# Patient Record
Sex: Female | Born: 1985 | Race: Black or African American | Hispanic: No | Marital: Single | Smoking: Never smoker
Health system: Southern US, Community
[De-identification: ages and names within clinical notes are randomized; demographics above are authoritative.]

## PROBLEM LIST (undated history)

## (undated) DIAGNOSIS — F99 Mental disorder, not otherwise specified: Secondary | ICD-10-CM

## (undated) DIAGNOSIS — K5904 Chronic idiopathic constipation: Secondary | ICD-10-CM

## (undated) DIAGNOSIS — F29 Unspecified psychosis not due to a substance or known physiological condition: Secondary | ICD-10-CM

## (undated) DIAGNOSIS — F72 Severe intellectual disabilities: Secondary | ICD-10-CM

## (undated) HISTORY — PX: NO PAST SURGERIES: SHX2092

---

## 2007-06-18 ENCOUNTER — Ambulatory Visit (HOSPITAL_COMMUNITY): Admission: RE | Admit: 2007-06-18 | Discharge: 2007-06-18 | Payer: Self-pay | Admitting: Obstetrics and Gynecology

## 2007-10-28 ENCOUNTER — Emergency Department (HOSPITAL_COMMUNITY): Admission: EM | Admit: 2007-10-28 | Discharge: 2007-10-30 | Payer: Self-pay | Admitting: Emergency Medicine

## 2007-12-08 ENCOUNTER — Emergency Department (HOSPITAL_COMMUNITY): Admission: EM | Admit: 2007-12-08 | Discharge: 2007-12-08 | Payer: Self-pay | Admitting: Emergency Medicine

## 2009-08-30 ENCOUNTER — Emergency Department (HOSPITAL_COMMUNITY): Admission: EM | Admit: 2009-08-30 | Discharge: 2009-08-30 | Payer: Self-pay | Admitting: Emergency Medicine

## 2010-09-02 ENCOUNTER — Inpatient Hospital Stay (INDEPENDENT_AMBULATORY_CARE_PROVIDER_SITE_OTHER)
Admission: RE | Admit: 2010-09-02 | Discharge: 2010-09-02 | Disposition: A | Discharge status: Home / Self Care | Payer: Medicare Other | Source: Ambulatory Visit | Attending: Family Medicine | Admitting: Family Medicine

## 2010-09-02 DIAGNOSIS — N390 Urinary tract infection, site not specified: Secondary | ICD-10-CM

## 2010-09-02 LAB — POCT URINALYSIS DIP (DEVICE)
Bilirubin Urine: NEGATIVE
Glucose, UA: NEGATIVE mg/dL
Hgb urine dipstick: NEGATIVE
Ketones, ur: NEGATIVE mg/dL
Protein, ur: 30 mg/dL — AB
Urobilinogen, UA: 0.2 mg/dL (ref 0.0–1.0)

## 2010-09-02 LAB — POCT PREGNANCY, URINE: Preg Test, Ur: NEGATIVE

## 2010-09-04 LAB — URINE CULTURE: Colony Count: 75000

## 2010-09-22 ENCOUNTER — Emergency Department (HOSPITAL_COMMUNITY)
Admission: EM | Admit: 2010-09-22 | Discharge: 2010-09-22 | Disposition: A | Discharge status: Home / Self Care | Payer: Medicare Other | Attending: Emergency Medicine | Admitting: Emergency Medicine

## 2010-09-22 ENCOUNTER — Inpatient Hospital Stay (INDEPENDENT_AMBULATORY_CARE_PROVIDER_SITE_OTHER)
Admission: RE | Admit: 2010-09-22 | Discharge: 2010-09-22 | Disposition: A | Discharge status: Home / Self Care | Payer: Medicare Other | Source: Ambulatory Visit | Attending: Emergency Medicine | Admitting: Emergency Medicine

## 2010-09-22 ENCOUNTER — Emergency Department (HOSPITAL_COMMUNITY): Payer: Medicare Other

## 2010-09-22 DIAGNOSIS — M549 Dorsalgia, unspecified: Secondary | ICD-10-CM | POA: Insufficient documentation

## 2010-09-22 DIAGNOSIS — F79 Unspecified intellectual disabilities: Secondary | ICD-10-CM | POA: Insufficient documentation

## 2010-09-22 DIAGNOSIS — Z79899 Other long term (current) drug therapy: Secondary | ICD-10-CM | POA: Insufficient documentation

## 2010-09-22 DIAGNOSIS — R079 Chest pain, unspecified: Secondary | ICD-10-CM | POA: Insufficient documentation

## 2010-09-22 LAB — POCT URINALYSIS DIP (DEVICE)
Bilirubin Urine: NEGATIVE
Glucose, UA: NEGATIVE mg/dL
Nitrite: NEGATIVE
Urobilinogen, UA: 0.2 mg/dL (ref 0.0–1.0)

## 2010-10-03 ENCOUNTER — Telehealth: Payer: Self-pay | Admitting: Family Medicine

## 2010-10-03 NOTE — Telephone Encounter (Signed)
spooke w pt--menorrhagia. Will set her up for pap and appt this week Denny Levy  [facility] Can you put her in on this weeks colpo clinic at 9 am for a pap and blood work [employer]! Denny Levy

## 2010-10-06 ENCOUNTER — Ambulatory Visit: Payer: Self-pay

## 2010-11-07 LAB — CBC
MCHC: 33
MCV: 85.7
Platelets: 352
RBC: 2.73 — ABNORMAL LOW
WBC: 7.5

## 2010-11-07 LAB — DIFFERENTIAL
Basophils Absolute: 0
Eosinophils Relative: 0
Lymphocytes Relative: 9 — ABNORMAL LOW
Lymphs Abs: 0.6 — ABNORMAL LOW
Monocytes Absolute: 0.6

## 2010-11-07 LAB — ETHANOL: Alcohol, Ethyl (B): <5

## 2010-11-07 LAB — BASIC METABOLIC PANEL
BUN: 22
CO2: 28
Calcium: 9.2
Creatinine, Ser: 0.91
GFR calc Af Amer: >60

## 2011-04-19 ENCOUNTER — Encounter (HOSPITAL_COMMUNITY)
Admission: RE | Admit: 2011-04-19 | Discharge: 2011-04-19 | Disposition: A | Discharge status: Home / Self Care | Payer: Medicare Other | Source: Ambulatory Visit | Attending: Dentistry | Admitting: Dentistry

## 2011-04-19 ENCOUNTER — Encounter (HOSPITAL_COMMUNITY): Payer: Self-pay | Admitting: Pharmacy Technician

## 2011-04-19 ENCOUNTER — Encounter (HOSPITAL_COMMUNITY): Payer: Self-pay

## 2011-04-19 HISTORY — DX: Mental disorder, not otherwise specified: F99

## 2011-04-19 HISTORY — DX: Severe intellectual disabilities: F72

## 2011-04-19 HISTORY — DX: Chronic idiopathic constipation: K59.04

## 2011-04-19 HISTORY — DX: Unspecified psychosis not due to a substance or known physiological condition: F29

## 2011-04-19 NOTE — Pre-Procedure Instructions (Signed)
22 Michele French  04/19/2011   Your procedure is scheduled on:  Friday, Mar 22  Report to [facility] at 1050 AM.  Call this number if you have problems the morning of surgery: [phone]   Remember:   Do not eat food:After Midnight.  May have clear liquids: up to 4 Hours before arrival.(0650 am)  Clear liquids include soda, tea, black coffee, apple or grape juice, broth.  Take these medicines the morning of surgery with A SIP OF WATER: *Seroquel,Lorazepam,Diphenhydramine**   Do not wear jewelry, make-up or nail polish.  Do not wear lotions, powders, or perfumes. You may wear deodorant.  Do not shave 48 hours prior to surgery.  Do not bring valuables to the hospital.  Contacts, dentures or bridgework may not be worn into surgery.  Leave suitcase in the car. After surgery it may be brought to your room.  For patients admitted to the hospital, checkout time is 11:00 AM the day of discharge.   Patients discharged the day of surgery will not be allowed to drive home.  Name and phone number of your driver: *Joneen Roach, caregiver**  Special Instructions: CHG Shower Use Special Wash: 1/2 bottle night before surgery and 1/2 bottle morning of surgery.   Please read over the following fact sheets that you were given: Surgical Site Infection Prevention

## 2011-04-28 ENCOUNTER — Ambulatory Visit (HOSPITAL_COMMUNITY)
Admission: RE | Admit: 2011-04-28 | Discharge: 2011-04-28 | Disposition: A | Discharge status: Home / Self Care | Payer: Medicare Other | Source: Ambulatory Visit | Attending: Dentistry | Admitting: Dentistry

## 2011-04-28 ENCOUNTER — Encounter (HOSPITAL_COMMUNITY)
Admission: RE | Disposition: A | Discharge status: Home / Self Care | Payer: Self-pay | Source: Ambulatory Visit | Attending: Dentistry

## 2011-04-28 ENCOUNTER — Encounter (HOSPITAL_COMMUNITY): Payer: Self-pay | Admitting: *Deleted

## 2011-04-28 ENCOUNTER — Encounter (HOSPITAL_COMMUNITY): Payer: Self-pay | Admitting: Anesthesiology

## 2011-04-28 ENCOUNTER — Encounter (HOSPITAL_COMMUNITY): Payer: Self-pay | Admitting: Dentistry

## 2011-04-28 ENCOUNTER — Ambulatory Visit (HOSPITAL_COMMUNITY): Payer: Medicare Other | Admitting: Anesthesiology

## 2011-04-28 DIAGNOSIS — F79 Unspecified intellectual disabilities: Secondary | ICD-10-CM | POA: Insufficient documentation

## 2011-04-28 DIAGNOSIS — K029 Dental caries, unspecified: Secondary | ICD-10-CM | POA: Insufficient documentation

## 2011-04-28 HISTORY — PX: TOOTH EXTRACTION: SHX859

## 2011-04-28 SURGERY — DENTAL RESTORATION/EXTRACTIONS
Anesthesia: General | Site: Mouth | Laterality: Bilateral | Wound class: Clean Contaminated

## 2011-04-28 MED ORDER — LACTATED RINGERS IV SOLN
INTRAVENOUS | Status: DC | PRN
  Administered 2011-04-28 (×2): via INTRAVENOUS

## 2011-04-28 MED ORDER — MIDAZOLAM HCL 2 MG/ML PO SYRP
ORAL_SOLUTION | ORAL | Status: AC
  Administered 2011-04-28: 20 mg via ORAL
  Filled 2011-04-28: qty 10

## 2011-04-28 MED ORDER — ONDANSETRON HCL 4 MG/2ML IJ SOLN
INTRAMUSCULAR | Status: DC | PRN
  Administered 2011-04-28: 4 mg via INTRAVENOUS

## 2011-04-28 MED ORDER — HEMOSTATIC AGENTS (NO CHARGE) OPTIME
TOPICAL | Status: DC | PRN
  Administered 2011-04-28: 1 via TOPICAL

## 2011-04-28 MED ORDER — PROPOFOL 10 MG/ML IV EMUL
INTRAVENOUS | Status: DC | PRN
  Administered 2011-04-28: 70 mg via INTRAVENOUS

## 2011-04-28 MED ORDER — FENTANYL CITRATE 0.05 MG/ML IJ SOLN
INTRAMUSCULAR | Status: DC | PRN
  Administered 2011-04-28: 50 ug via INTRAVENOUS
  Administered 2011-04-28: 100 ug via INTRAVENOUS
  Administered 2011-04-28: 25 ug via INTRAVENOUS

## 2011-04-28 MED ORDER — OXYMETAZOLINE HCL 0.05 % NA SOLN
NASAL | Status: DC | PRN
  Administered 2011-04-28: 2 via NASAL

## 2011-04-28 MED ORDER — LIDOCAINE-EPINEPHRINE 2 %-1:100000 IJ SOLN
INTRAMUSCULAR | Status: DC | PRN
  Administered 2011-04-28: 5 mL

## 2011-04-28 SURGICAL SUPPLY — 40 items
BLADE SURG 15 STRL LF DISP TIS (BLADE) IMPLANT
BLADE SURG 15 STRL SS (BLADE)
CANISTER SUCTION 2500CC (MISCELLANEOUS) ×2 IMPLANT
CLOTH BEACON ORANGE TIMEOUT ST (SAFETY) ×2 IMPLANT
CONT SPEC 4OZ CLIKSEAL STRL BL (MISCELLANEOUS) IMPLANT
COVER PROBE W GEL 5X96 (DRAPES) ×2 IMPLANT
COVER SURGICAL LIGHT HANDLE (MISCELLANEOUS) ×2 IMPLANT
COVER TABLE BACK 60X90 (DRAPES) ×2 IMPLANT
DECANTER SPIKE VIAL GLASS SM (MISCELLANEOUS) IMPLANT
DRAPE PROXIMA HALF (DRAPES) ×2 IMPLANT
ELECT COATED BLADE 2.86 ST (ELECTRODE) ×2 IMPLANT
ELECT REM PT RETURN 9FT ADLT (ELECTROSURGICAL) ×2
ELECTRODE REM PT RTRN 9FT ADLT (ELECTROSURGICAL) ×1 IMPLANT
GAUZE PACKING FOLDED 2  STR (GAUZE/BANDAGES/DRESSINGS)
GAUZE PACKING FOLDED 2 STR (GAUZE/BANDAGES/DRESSINGS) IMPLANT
GAUZE SPONGE 2X2 8PLY STRL LF (GAUZE/BANDAGES/DRESSINGS) IMPLANT
GAUZE SPONGE 4X4 16PLY XRAY LF (GAUZE/BANDAGES/DRESSINGS) ×2 IMPLANT
GLOVE BIO SURGEON STRL SZ7.5 (GLOVE) ×2 IMPLANT
GLOVE SS BIOGEL STRL SZ 6.5 (GLOVE) ×1 IMPLANT
GLOVE SUPERSENSE BIOGEL SZ 6.5 (GLOVE) ×1
GOWN STRL NON-REIN LRG LVL3 (GOWN DISPOSABLE) ×4 IMPLANT
KIT BASIN OR (CUSTOM PROCEDURE TRAY) ×2 IMPLANT
KIT ROOM TURNOVER OR (KITS) ×2 IMPLANT
NEEDLE 27GAX1X1/2 (NEEDLE) ×2 IMPLANT
NEEDLE FILTER BLUNT 18X 1/2SAF (NEEDLE)
NEEDLE FILTER BLUNT 18X1 1/2 (NEEDLE) IMPLANT
PAD ARMBOARD 7.5X6 YLW CONV (MISCELLANEOUS) ×2 IMPLANT
PENCIL BUTTON HOLSTER BLD 10FT (ELECTRODE) ×2 IMPLANT
SPONGE GAUZE 2X2 STER 10/PKG (GAUZE/BANDAGES/DRESSINGS)
SPONGE GAUZE 4X4 12PLY (GAUZE/BANDAGES/DRESSINGS) IMPLANT
SPONGE SURGIFOAM ABS GEL 12-7 (HEMOSTASIS) ×2 IMPLANT
SPONGE SURGIFOAM ABS GEL SZ50 (HEMOSTASIS) IMPLANT
SUT CHROMIC 3 0 PS 2 (SUTURE) ×2 IMPLANT
SYR CONTROL 10ML LL (SYRINGE) ×2 IMPLANT
TOOTHBRUSH ADULT (PERSONAL CARE ITEMS) IMPLANT
TOWEL OR 17X24 6PK STRL BLUE (TOWEL DISPOSABLE) ×2 IMPLANT
TOWEL OR 17X26 10 PK STRL BLUE (TOWEL DISPOSABLE) IMPLANT
TUBE CONNECTING 12X1/4 (SUCTIONS) ×2 IMPLANT
WATER STERILE IRR 1000ML POUR (IV SOLUTION) ×4 IMPLANT
YANKAUER SUCT BULB TIP NO VENT (SUCTIONS) ×2 IMPLANT

## 2011-04-28 NOTE — Transfer of Care (Signed)
Immediate Anesthesia Transfer of Care Note  Patient: Michele French  Procedure(s) Performed: Procedure(s) (LRB): DENTAL RESTORATION/EXTRACTIONS (Bilateral)  Patient Location: PACU  Anesthesia Type: General  Level of Consciousness: awake  Airway & Oxygen Therapy: Patient Spontanous Breathing and Patient connected to nasal cannula oxygen  Post-op Assessment: Report given to PACU RN and Post -op Vital signs reviewed and stable  Post vital signs: Reviewed  Complications: No apparent anesthesia complications

## 2011-04-28 NOTE — Anesthesia Preprocedure Evaluation (Addendum)
Anesthesia Evaluation  Patient identified by MRN, date of birth, ID band Patient unresponsive    Reviewed: Allergy & Precautions, H&P , NPO status , Patient's Chart, lab work & pertinent test results  History of Anesthesia Complications Negative for: history of anesthetic complications  Airway Mallampati: II TM Distance: >3 FB Neck ROM: Full    Dental  (+) Poor Dentition and Dental Advisory Given   Pulmonary neg pulmonary ROS,  breath sounds clear to auscultation  Pulmonary exam normal       Cardiovascular negative cardio ROS  Rhythm:Regular     Neuro/Psych PSYCHIATRIC DISORDERS Bipolar Disorder Schizophrenia Severe MR   GI/Hepatic negative GI ROS, Neg liver ROS,   Endo/Other    Renal/GU negative Renal ROS     Musculoskeletal   Abdominal   Peds  Hematology   Anesthesia Other Findings   Reproductive/Obstetrics                          Anesthesia Physical Anesthesia Plan  ASA: III  Anesthesia Plan: General   Post-op Pain Management:    Induction: Intravenous  Airway Management Planned: Nasal ETT  Additional Equipment:   Intra-op Plan:   Post-operative Plan: Extubation in OR  Informed Consent: I have reviewed the patients History and Physical, chart, labs and discussed the procedure including the risks, benefits and alternatives for the proposed anesthesia with the patient or authorized representative who has indicated his/her understanding and acceptance.   Dental advisory given  Plan Discussed with: CRNA, Anesthesiologist and Surgeon  Anesthesia Plan Comments:         Anesthesia Quick Evaluation

## 2011-04-28 NOTE — Discharge Instructions (Signed)
See Extraction Sheet - give to patient

## 2011-04-28 NOTE — Progress Notes (Signed)
Order receive by DR singer to give 20ml po versed.

## 2011-04-28 NOTE — Progress Notes (Signed)
Unable to find extraction instructions in EPIC.  Dr. Martie Round notified.  He stated that he head given extraction instructions to [facility], Group home care giver.  Marcelene Butte, group home care giver notified.//L. Andriea Hasegawa,RN

## 2011-04-28 NOTE — Preoperative (Signed)
Beta Blockers   Reason not to administer Beta Blockers:Not Applicable 

## 2011-04-28 NOTE — Op Note (Signed)
Recall Exam, FMX, FMD, 4 Extractions, 1 Composite with 5cc 2%Lidocaine 1:100,000 epinephrine

## 2011-04-28 NOTE — Anesthesia Procedure Notes (Signed)
Procedure Name: Intubation Date/Time: 04/28/2011 12:39 PM Performed by: Lovie Chol Pre-anesthesia Checklist: Patient identified, Emergency Drugs available, Suction available and Patient being monitored Patient Re-evaluated:Patient Re-evaluated prior to inductionOxygen Delivery Method: Circle system utilized Preoxygenation: Pre-oxygenation with 100% oxygen Intubation Type: Inhalational induction Ventilation: Mask ventilation without difficulty Laryngoscope Size: Miller and 2 Grade View: Grade I Nasal Tubes: Right and Nasal Rae Number of attempts: 1 Placement Confirmation: ETT inserted through vocal cords under direct vision,  positive ETCO2 and breath sounds checked- equal and bilateral Secured at: 27 cm Tube secured with: Tape Dental Injury: Teeth and Oropharynx as per pre-operative assessment

## 2011-04-28 NOTE — Brief Op Note (Addendum)
04/28/2011  2:04 PM  PATIENT:  Michele French  26 y.o. female  PRE-OPERATIVE DIAGNOSIS:  DENTAL CARIES  POST-OPERATIVE DIAGNOSIS:    PROCEDURE:  Procedure(s) (LRB): DENTAL RESTORATION/EXTRACTIONS (Bilateral)  SURGEON:  Surgeon(s) and Role:    * Esaw Dace., DDS - Primary  PHYSICIAN ASSISTANT:   ASSISTANTS: Laqueta Carina   ANESTHESIA:   general  EBL:  Total I/O In: 1100 [I.V.:1100] Out: -   BLOOD ADMINISTERED:none  DRAINS: none   LOCAL MEDICATIONS USED:  LIDOCAINE   SPECIMEN:  No Specimen  DISPOSITION OF SPECIMEN:  N/A  COUNTS:  YES  TOURNIQUET:  * No tourniquets in log *  DICTATION: .Phone dictation  PLAN OF CARE: Recall Exam, FMX, FMD, 4 extractions, 1 composite  PATIENT DISPOSITION:  Discharged to home   Delay start of Pharmacological VTE agent (>24hrs) due to surgical blood loss or risk of bleeding: no

## 2011-04-28 NOTE — H&P (Signed)
H&P Reviewed. Pt re-examined. No changes.  

## 2011-04-29 NOTE — Op Note (Signed)
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  Dental care provided in the operating room for medically necessary treatment.  OPERATION:  Full mouth oral rehabilitation including exam, x-rays, cleaning, extractions and operative.  SURGEON:  Azucena Freed, DDS  ASSISTANT:  Laqueta Carina.  PROCEDURE:  The patient was brought in to the operating room and placed in the supine position.  General anesthesia was administered via nasal intubation.  The patient was prepped and draped in the usual manner for an intraoral general dentistry procedure.  The oropharynx was suctioned and a moistened posterior throat pack was placed.  A full intraoral exam including all hard and soft tissues was performed.  This was a recall exam.  Soft tissue exam reveals floor of the mouth, buccal mucosa, soft palate, hard palate, tongue, gingiva, and frenum attachments, all within normal limits with regard to size, color, and consistency.  The hard tissue exam reveals tooth #1 through #32 present.  A full mouth digital x-rays were taken and a full mouth debridement was completed.  Operative care was provided with a high-speed handpiece of #557 and #4 bur and a slow-speed handpiece #4 bur.  Composite was completed on the facial of #29, extractions were completed on #7 through #10.  An amalgam was completed on #3, AMOF.  Gelfoam was placed in the socket and 3-o chromic suture was used to close the extraction site.  5 mL of 2% lidocaine with 1:100,000 epinephrine was given and estimated blood loss of 20 mL. Mouth  was suctioned dry, a posterior throat pack was carefully removed with constant suction and the patient was awakened in the OR and transferred to the recovery room in good condition.  A 600 mg ibuprofen was prescribed postop with 12 tabs given every 4 hours p.r.n. pain.     Esaw Dace., D.D.S.     WEM/MEDQ  D:  04/28/2011  T:  04/29/2011  Job:  [id number]

## 2011-05-01 ENCOUNTER — Encounter (HOSPITAL_COMMUNITY): Payer: Self-pay | Admitting: Dentistry

## 2011-05-02 NOTE — Anesthesia Postprocedure Evaluation (Signed)
Anesthesia Post Note  Patient: Michele French  Procedure(s) Performed: Procedure(s) (LRB): DENTAL RESTORATION/EXTRACTIONS (Bilateral)  Anesthesia type: general  Patient location: PACU  Post pain: Pain level controlled  Post assessment: Patient's Cardiovascular Status Stable  Last Vitals:  Filed Vitals:   04/28/11 1455  BP: 145/95  Pulse: 100  Temp:   Resp: 20    Post vital signs: Reviewed and stable  Level of consciousness: sedated  Complications: No apparent anesthesia complications

## 2011-10-06 ENCOUNTER — Encounter: Payer: Self-pay | Admitting: Dentistry

## 2012-02-29 ENCOUNTER — Encounter (HOSPITAL_BASED_OUTPATIENT_CLINIC_OR_DEPARTMENT_OTHER): Payer: Self-pay | Admitting: *Deleted

## 2012-02-29 ENCOUNTER — Emergency Department (HOSPITAL_BASED_OUTPATIENT_CLINIC_OR_DEPARTMENT_OTHER): Payer: Medicare Other

## 2012-02-29 ENCOUNTER — Emergency Department (HOSPITAL_BASED_OUTPATIENT_CLINIC_OR_DEPARTMENT_OTHER)
Admission: EM | Admit: 2012-02-29 | Discharge: 2012-02-29 | Disposition: A | Discharge status: Home / Self Care | Payer: Medicare Other | Attending: Emergency Medicine | Admitting: Emergency Medicine

## 2012-02-29 DIAGNOSIS — S060X0A Concussion without loss of consciousness, initial encounter: Secondary | ICD-10-CM | POA: Insufficient documentation

## 2012-02-29 DIAGNOSIS — W1809XA Striking against other object with subsequent fall, initial encounter: Secondary | ICD-10-CM | POA: Insufficient documentation

## 2012-02-29 DIAGNOSIS — Z789 Other specified health status: Secondary | ICD-10-CM | POA: Insufficient documentation

## 2012-02-29 DIAGNOSIS — S0230XA Fracture of orbital floor, unspecified side, initial encounter for closed fracture: Secondary | ICD-10-CM | POA: Insufficient documentation

## 2012-02-29 DIAGNOSIS — S060X9A Concussion with loss of consciousness of unspecified duration, initial encounter: Secondary | ICD-10-CM

## 2012-02-29 DIAGNOSIS — F29 Unspecified psychosis not due to a substance or known physiological condition: Secondary | ICD-10-CM | POA: Insufficient documentation

## 2012-02-29 DIAGNOSIS — F72 Severe intellectual disabilities: Secondary | ICD-10-CM | POA: Insufficient documentation

## 2012-02-29 DIAGNOSIS — Z593 Problems related to living in residential institution: Secondary | ICD-10-CM | POA: Insufficient documentation

## 2012-02-29 DIAGNOSIS — Z79899 Other long term (current) drug therapy: Secondary | ICD-10-CM | POA: Insufficient documentation

## 2012-02-29 DIAGNOSIS — Y9289 Other specified places as the place of occurrence of the external cause: Secondary | ICD-10-CM | POA: Insufficient documentation

## 2012-02-29 DIAGNOSIS — Z8719 Personal history of other diseases of the digestive system: Secondary | ICD-10-CM | POA: Insufficient documentation

## 2012-02-29 DIAGNOSIS — Y939 Activity, unspecified: Secondary | ICD-10-CM | POA: Insufficient documentation

## 2012-02-29 MED ORDER — ONDANSETRON 4 MG PO TBDP: 4.0000 mg | ORAL_TABLET | Freq: Three times a day (TID) | ORAL | Status: AC | PRN

## 2012-02-29 MED ORDER — ACETAMINOPHEN 500 MG PO TABS: 500.0000 mg | ORAL_TABLET | Freq: Four times a day (QID) | ORAL | Status: AC | PRN

## 2012-02-29 MED ORDER — ONDANSETRON 4 MG PO TBDP
4.0000 mg | ORAL_TABLET | Freq: Once | ORAL | Status: AC
  Administered 2012-02-29: 4 mg via ORAL

## 2012-02-29 MED ORDER — ONDANSETRON 4 MG PO TBDP
ORAL_TABLET | ORAL | Status: AC
  Administered 2012-02-29: 4 mg via ORAL
  Filled 2012-02-29: qty 1

## 2012-02-29 NOTE — ED Notes (Signed)
Patient transported to CT 

## 2012-02-29 NOTE — ED Notes (Signed)
MD at bedside. 
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
## 2012-03-01 ENCOUNTER — Encounter (HOSPITAL_COMMUNITY): Payer: Self-pay | Admitting: *Deleted

## 2012-03-01 ENCOUNTER — Emergency Department (HOSPITAL_COMMUNITY)
Admission: EM | Admit: 2012-03-01 | Discharge: 2012-03-01 | Disposition: A | Discharge status: Home / Self Care | Payer: Medicare Other | Attending: Emergency Medicine | Admitting: Emergency Medicine

## 2012-03-01 DIAGNOSIS — F29 Unspecified psychosis not due to a substance or known physiological condition: Secondary | ICD-10-CM | POA: Insufficient documentation

## 2012-03-01 DIAGNOSIS — K5900 Constipation, unspecified: Secondary | ICD-10-CM | POA: Insufficient documentation

## 2012-03-01 DIAGNOSIS — Z79899 Other long term (current) drug therapy: Secondary | ICD-10-CM | POA: Insufficient documentation

## 2012-03-01 DIAGNOSIS — F99 Mental disorder, not otherwise specified: Secondary | ICD-10-CM

## 2012-03-01 DIAGNOSIS — R000 Tachycardia, unspecified: Secondary | ICD-10-CM | POA: Insufficient documentation

## 2012-03-01 DIAGNOSIS — F919 Conduct disorder, unspecified: Secondary | ICD-10-CM | POA: Insufficient documentation

## 2012-03-01 DIAGNOSIS — F72 Severe intellectual disabilities: Secondary | ICD-10-CM | POA: Insufficient documentation

## 2012-03-01 DIAGNOSIS — Z3202 Encounter for pregnancy test, result negative: Secondary | ICD-10-CM | POA: Insufficient documentation

## 2012-03-01 LAB — RAPID URINE DRUG SCREEN, HOSP PERFORMED
Amphetamines: NOT DETECTED
Barbiturates: NOT DETECTED
Benzodiazepines: NOT DETECTED
Cocaine: NOT DETECTED
Opiates: NOT DETECTED
Tetrahydrocannabinol: NOT DETECTED

## 2012-03-01 LAB — COMPREHENSIVE METABOLIC PANEL
ALT: 13 U/L (ref 0–35)
AST: 20 U/L (ref 0–37)
Albumin: 3.3 g/dL — ABNORMAL LOW (ref 3.5–5.2)
Alkaline Phosphatase: 35 U/L — ABNORMAL LOW (ref 39–117)
BUN: 8 mg/dL (ref 6–23)
Chloride: 102 mEq/L (ref 96–112)
Potassium: 3.6 mEq/L (ref 3.5–5.1)
Sodium: 139 mEq/L (ref 135–145)
Total Bilirubin: 0.2 mg/dL — ABNORMAL LOW (ref 0.3–1.2)
Total Protein: 7.3 g/dL (ref 6.0–8.3)

## 2012-03-01 LAB — POCT PREGNANCY, URINE: Preg Test, Ur: NEGATIVE

## 2012-03-01 LAB — CBC
HCT: 40 % (ref 36.0–46.0)
Platelets: 216 10*3/uL (ref 150–400)
RDW: 13.8 % (ref 11.5–15.5)
WBC: 6.6 10*3/uL (ref 4.0–10.5)

## 2012-03-01 LAB — ETHANOL: Alcohol, Ethyl (B): <11 mg/dL (ref 0–11)

## 2012-03-01 MED ORDER — ALUM & MAG HYDROXIDE-SIMETH 200-200-20 MG/5ML PO SUSP: 30.0000 mL | ORAL | Status: DC | PRN

## 2012-03-01 MED ORDER — IBUPROFEN 200 MG PO TABS: 600.0000 mg | ORAL_TABLET | Freq: Three times a day (TID) | ORAL | Status: DC | PRN

## 2012-03-01 MED ORDER — ONDANSETRON HCL 4 MG PO TABS: 4.0000 mg | ORAL_TABLET | Freq: Three times a day (TID) | ORAL | Status: DC | PRN

## 2012-03-01 MED ORDER — LORAZEPAM 1 MG PO TABS: 1.0000 mg | ORAL_TABLET | Freq: Three times a day (TID) | ORAL | Status: DC | PRN

## 2012-03-01 MED ORDER — NICOTINE 21 MG/24HR TD PT24: 21.0000 mg | MEDICATED_PATCH | Freq: Every day | TRANSDERMAL | Status: DC

## 2012-03-01 MED ORDER — ZOLPIDEM TARTRATE 5 MG PO TABS: 5.0000 mg | ORAL_TABLET | Freq: Every evening | ORAL | Status: DC | PRN

## 2012-03-01 MED ORDER — QUETIAPINE FUMARATE 200 MG PO TABS: 200.0000 mg | ORAL_TABLET | Freq: Two times a day (BID) | ORAL | Status: DC

## 2012-03-01 NOTE — ED Provider Notes (Signed)
History     CSN: [id number]  Arrival date & time 03/01/12  1051   First MD Initiated Contact with Patient 03/01/12 1110      Chief Complaint  Patient presents with  . Medical Clearance  . Behavior Problem    (Consider location/radiation/quality/duration/timing/severity/associated sxs/prior treatment) HPI Comments: 27 year old female with a history of severe mental retardation brought in to the emergency department by her caregiver Cyndi Lennert do to worsening behavior problems. Yesterday she hit her head on the window of the bus and was diagnosed with a left-sided orbital facial fracture and concussion at [facility]. Caregiver states patient tried to do the same today. She has been more aggressive than normal over the past couple days. Also spent talking to herself in the third person stating "Elnita Maxwell stopped doing that, get off of me". Caregiver is concerned and feels as if her medications need to be adjusted. Caregiver states patient is currently at her baseline where she does not answer questions and barely speaks.  The history is provided by a caregiver.    Past Medical History  Diagnosis Date  . Mental disorder   . Psychosis   . Severe mental retardation   . Constipation - functional     Past Surgical History  Procedure Date  . No past surgeries   . Tooth extraction 04/28/2011    Procedure: DENTAL RESTORATION/EXTRACTIONS;  Surgeon: Esaw Dace., DDS;  Location: [facility] OR;  Service: Oral Surgery;  Laterality: Bilateral;  exam, cleaning, xrays, extractions teeth number seven, eight, nine, and ten; composite filling tooth number twenty-nine.    No family history on file.  History  Substance Use Topics  . Smoking status: Never Smoker   . Smokeless tobacco: Never Used  . Alcohol Use: No    OB History    Grav Para Term Preterm Abortions TAB SAB Ect Mult Living                  Review of Systems  Unable to perform ROS: Psychiatric disorder    Psychiatric/Behavioral: Positive for behavioral problems and self-injury. The patient is hyperactive.     Allergies  Review of patient's allergies indicates no known allergies.  Home Medications   Current Outpatient Rx  Name  Route  Sig  Dispense  Refill  . ALPRAZOLAM 0.5 MG PO TABS   Oral   Take 0.5 mg by mouth at bedtime as needed. For anxiety/sleep.         Marland Kitchen DIPHENHYDRAMINE HCL (SLEEP) 25 MG PO TABS   Oral   Take 25 mg by mouth 2 (two) times daily.         Marland Kitchen DIVALPROEX SODIUM 500 MG PO TBEC   Oral   Take 1,000 mg by mouth at bedtime.         Marland Kitchen DOCUSATE SODIUM 100 MG PO CAPS   Oral   Take 100 mg by mouth 2 (two) times daily.         Marland Kitchen GABAPENTIN 300 MG PO CAPS   Oral   Take 300 mg by mouth 2 (two) times daily.         Marland Kitchen LEVONORGEST-ETH ESTRAD 91-DAY 0.15-0.03 MG PO TABS   Oral   Take 1 tablet by mouth daily.         Marland Kitchen LORAZEPAM 1 MG PO TABS   Oral   Take 1 mg by mouth every 8 (eight) hours.         . ONDANSETRON 4 MG  PO TBDP   Oral   Take 1 tablet (4 mg total) by mouth every 8 (eight) hours as needed for nausea.   20 tablet   0   . POLYETHYLENE GLYCOL 3350 PO PACK   Oral   Take 17 g by mouth daily as needed. For constipation         . QUETIAPINE FUMARATE 200 MG PO TABS   Oral   Take 200 mg by mouth 2 (two) times daily.         . TRAZODONE HCL 100 MG PO TABS   Oral   Take 200 mg by mouth at bedtime.         . ACETAMINOPHEN 500 MG PO TABS   Oral   Take 1 tablet (500 mg total) by mouth every 6 (six) hours as needed for pain.   30 tablet   0     BP 129/87  Pulse 118  Temp 98.3 F (36.8 C) (Oral)  Resp 14  SpO2 100%  Physical Exam  Nursing note and vitals reviewed. Constitutional: She appears well-developed.  HENT:  Head:    Nose: Nose normal.  Eyes: Conjunctivae normal and EOM are normal. Pupils are equal, round, and reactive to light.  Neck: Normal range of motion. Neck supple.  Cardiovascular: Regular rhythm,  normal heart sounds and intact distal pulses.  Tachycardia present.   Pulmonary/Chest: Effort normal and breath sounds normal.  Musculoskeletal: Normal range of motion.  Neurological: She is alert.  Skin: Skin is warm and dry.  Psychiatric: She is withdrawn. She is noncommunicative.    ED Course  Procedures (including critical care time)   Labs Reviewed  CBC  COMPREHENSIVE METABOLIC PANEL  ETHANOL  URINE RAPID DRUG SCREEN (HOSP PERFORMED)   Ct Head Wo Contrast  02/29/2012  *RADIOLOGY REPORT*  Clinical Data: Vomiting and bruising and swelling to the left side of the head following a fall and head injury.  CT HEAD WITHOUT CONTRAST  Technique:  Contiguous axial images were obtained from the base of the skull through the vertex without contrast.  Comparison: None.  Findings: Normal appearing cerebral hemispheres and posterior fossa structures.  Normal size and position of the ventricles.  No skull fracture or intracranial hemorrhage.  There is a medium and high density fluid in the left maxillary sinus as well as a tiny amount of air in the left orbit.  IMPRESSION:  1.  Probable left orbital floor fracture with an associated tiny amount of air in the left orbit and blood in the left maxillary sinus.  A maxillofacial CT, without contrast, is recommended. 2.  No skull fracture or intracranial hemorrhage.   Original Report Authenticated By: Beckie Salts, M.D.    Ct Cervical Spine Wo Contrast  02/29/2012  *RADIOLOGY REPORT*  Clinical Data:  Status post fall with head injury 3 hours ago. Vomiting for 1 hour, with bruising on the left side of the head and face.  Concern for cervical spine injury.  CT MAXILLOFACIAL WITHOUT CONTRAST CT CERVICAL SPINE WITHOUT CONTRAST  Technique:  Multidetector CT imaging of the cervical spine, and maxillofacial structures were performed using the standard protocol without intravenous contrast. Multiplanar CT image reconstructions of the cervical spine and maxillofacial  structures were also generated.  Comparison:  None.  CT MAXILLOFACIAL  Findings:  There is a minimally displaced fracture involving the mid to posterior aspect of the left orbital floor, extending horizontally, with herniation of intraorbital fat medially.  There is no definite evidence of entrapment  at this time.  No definite intraorbital blood is seen.  There is partial opacification of the left maxillary sinus with blood.  No additional fractures are identified.  The maxilla and mandible appear intact.  The nasal bone is unremarkable in appearance.  The visualized dentition demonstrates no acute abnormality.  The right orbit appears intact.  The remaining visualized paranasal sinuses and mastoid air cells are well-aerated.  Soft tissue swelling is noted about the left orbit, and overlying the left maxilla.  The parapharyngeal fat planes are preserved. The nasopharynx, oropharynx and hypopharynx are unremarkable in appearance.  The visualized portions of the valleculae and piriform sinuses are grossly unremarkable.  The parotid and submandibular glands are within normal limits.  No cervical lymphadenopathy is seen.  IMPRESSION:  1.  Minimally displaced fracture involving the mid to posterior aspect of the left orbital floor, extending horizontally, with herniation of intraorbital fat medially.  No definite evidence of entrapment at this time, though the fracture runs adjacent to the inferior rectus muscle. 2.  Partial opacification of the left maxillary sinus with blood. 3.  Soft tissue swelling about the left orbit, and overlying the left maxilla.  CT CERVICAL SPINE  Findings:   There is no evidence of fracture or subluxation. Vertebral bodies demonstrate normal height and alignment. Intervertebral disc spaces are preserved.  Prevertebral soft tissues are within normal limits.  The visualized neural foramina are grossly unremarkable.  The thyroid gland is unremarkable in appearance.  The minimally visualized lung  apices are clear.  No significant soft tissue abnormalities are seen.  IMPRESSION: No evidence of fracture or subluxation along the cervical spine.   Original Report Authenticated By: Tonia Ghent, M.D.    Ct Maxillofacial Wo Cm  02/29/2012  *RADIOLOGY REPORT*  Clinical Data:  Status post fall with head injury 3 hours ago. Vomiting for 1 hour, with bruising on the left side of the head and face.  Concern for cervical spine injury.  CT MAXILLOFACIAL WITHOUT CONTRAST CT CERVICAL SPINE WITHOUT CONTRAST  Technique:  Multidetector CT imaging of the cervical spine, and maxillofacial structures were performed using the standard protocol without intravenous contrast. Multiplanar CT image reconstructions of the cervical spine and maxillofacial structures were also generated.  Comparison:  None.  CT MAXILLOFACIAL  Findings:  There is a minimally displaced fracture involving the mid to posterior aspect of the left orbital floor, extending horizontally, with herniation of intraorbital fat medially.  There is no definite evidence of entrapment at this time.  No definite intraorbital blood is seen.  There is partial opacification of the left maxillary sinus with blood.  No additional fractures are identified.  The maxilla and mandible appear intact.  The nasal bone is unremarkable in appearance.  The visualized dentition demonstrates no acute abnormality.  The right orbit appears intact.  The remaining visualized paranasal sinuses and mastoid air cells are well-aerated.  Soft tissue swelling is noted about the left orbit, and overlying the left maxilla.  The parapharyngeal fat planes are preserved. The nasopharynx, oropharynx and hypopharynx are unremarkable in appearance.  The visualized portions of the valleculae and piriform sinuses are grossly unremarkable.  The parotid and submandibular glands are within normal limits.  No cervical lymphadenopathy is seen.  IMPRESSION:  1.  Minimally displaced fracture involving the mid  to posterior aspect of the left orbital floor, extending horizontally, with herniation of intraorbital fat medially.  No definite evidence of entrapment at this time, though the fracture runs adjacent to the inferior rectus  muscle. 2.  Partial opacification of the left maxillary sinus with blood. 3.  Soft tissue swelling about the left orbit, and overlying the left maxilla.  CT CERVICAL SPINE  Findings:   There is no evidence of fracture or subluxation. Vertebral bodies demonstrate normal height and alignment. Intervertebral disc spaces are preserved.  Prevertebral soft tissues are within normal limits.  The visualized neural foramina are grossly unremarkable.  The thyroid gland is unremarkable in appearance.  The minimally visualized lung apices are clear.  No significant soft tissue abnormalities are seen.  IMPRESSION: No evidence of fracture or subluxation along the cervical spine.   Original Report Authenticated By: Tonia Ghent, M.D.      No diagnosis found.    MDM  27 y/o female with behavior problem. telepsych consulted. Medically cleared.        Trevor Mace, PA-C 03/01/12 1208

## 2012-03-01 NOTE — ED Notes (Signed)
Telepsychiatric Consult is been performed now. Group home representative in room during the interview.

## 2012-03-01 NOTE — ED Notes (Signed)
Pt added to AC's sitter list. No sitter available at this time.

## 2012-03-01 NOTE — ED Notes (Signed)
Telepsychiatric completed in room.

## 2012-03-01 NOTE — ED Notes (Signed)
Pt social worker, Archie Patten and pt verbalizes understanding

## 2012-03-01 NOTE — ED Notes (Signed)
Archie Patten the direct caregiver just came back to the hospital from running errands. States that she is not going to be taking the pt back to the group home and that a lady by the name of Cyndi Lennert is coming to get the pt. She left this number for [place] to call [phone].  The direct caregiver Archie Patten also stated that the seroquel order that she reported to pharmacy this am is incorrect and needs to be addressed prior to d/c.

## 2012-03-01 NOTE — ED Notes (Addendum)
Pt has one personal belongings bag at triage nurses station. Clothing only. Security has wanded pt and searched belongings.

## 2012-03-01 NOTE — ED Notes (Signed)
Telepsyhchiatric Referral faxed sent.

## 2012-03-01 NOTE — ED Notes (Signed)
Pt has severe MR and lives at a group home. Bus driver for group home here with her. Guardian is Nemiah Commander, [facility]. [phone]. SW is Cyndi Lennert, [phone]. Bus driver reports yesterday pt began hitting her head against a window which is normal for her, however she was more aggressive and unbuckled herself, threw herself on the floor continuing to hit her head and caused an L orbital Fx and concussion for which she was seen yesterday at [facility]. Today began doing same behavior but was talking to self in third person stating "Elnita Maxwell, stop doing that, get off of me." Driver concerned, requesting psych eval and to make sure pt did not worsen original injury. Pt at baseline currently but not answering this RNs questions, seldomly speaking to driver. Bruising and swelling noted to L eye.

## 2012-03-18 NOTE — ED Provider Notes (Signed)
Medical screening examination/treatment/procedure(s) were conducted as a shared visit with non-physician practitioner(s) and myself.  I personally evaluated the patient during the encounter.  24 old-year-old woman with mental retardation and aggressive aberrant behavior.  Will get behavioral health consult   Donnetta Hutching, MD 03/18/12 [phone]

## 2013-11-25 ENCOUNTER — Emergency Department (HOSPITAL_COMMUNITY)
Admission: EM | Admit: 2013-11-25 | Discharge: 2013-11-25 | Disposition: A | Discharge status: Home / Self Care | Payer: Medicare Other | Attending: Emergency Medicine | Admitting: Emergency Medicine

## 2013-11-25 ENCOUNTER — Encounter (HOSPITAL_COMMUNITY): Payer: Self-pay | Admitting: Emergency Medicine

## 2013-11-25 DIAGNOSIS — Z8659 Personal history of other mental and behavioral disorders: Secondary | ICD-10-CM | POA: Insufficient documentation

## 2013-11-25 DIAGNOSIS — Y9289 Other specified places as the place of occurrence of the external cause: Secondary | ICD-10-CM | POA: Insufficient documentation

## 2013-11-25 DIAGNOSIS — S0990XA Unspecified injury of head, initial encounter: Secondary | ICD-10-CM | POA: Diagnosis present

## 2013-11-25 DIAGNOSIS — Z79899 Other long term (current) drug therapy: Secondary | ICD-10-CM | POA: Diagnosis not present

## 2013-11-25 DIAGNOSIS — W2209XA Striking against other stationary object, initial encounter: Secondary | ICD-10-CM | POA: Diagnosis not present

## 2013-11-25 DIAGNOSIS — S0101XA Laceration without foreign body of scalp, initial encounter: Secondary | ICD-10-CM

## 2013-11-25 DIAGNOSIS — Y9389 Activity, other specified: Secondary | ICD-10-CM | POA: Diagnosis not present

## 2013-11-25 MED ORDER — LIDOCAINE-EPINEPHRINE-TETRACAINE (LET) SOLUTION
3.0000 mL | Freq: Once | NASAL | Status: AC
  Administered 2013-11-25: 3 mL via TOPICAL
  Filled 2013-11-25: qty 3

## 2013-11-25 NOTE — ED Provider Notes (Signed)
CSN: [id number]     Arrival date & time 11/25/13  1710 History  This chart was scribed for Michele Mornavid Brodin Gelpi, NP working with Michele JesterKathleen McManus, DO by Michele French, ED Scribe. This patient was seen in room TR07C/TR07C and the patient's care was started at 5:40 PM.    Chief Complaint  Patient presents with  . Head Injury   The history is provided by a parent. No language interpreter was used.   HPI Comments: Michele French is a 28 y.o. female with PMHx of mental retardation who presents to the Emergency Department complaining of head injury onset PTA. Mother states that pt hit her head up against the door hinge and presents with small laceration to the back of the head. Mother states that she tried to apply cold compress and ice with no relief. Mother states that pt is at baseline here in the ED.  Denies LOC or vomiting.   Past Medical History  Diagnosis Date  . Mental disorder   . Psychosis   . Severe mental retardation   . Constipation - functional    Past Surgical History  Procedure Laterality Date  . No past surgeries    . Tooth extraction  04/28/2011    Procedure: DENTAL RESTORATION/EXTRACTIONS;  Surgeon: Esaw DaceWilliam E Milner Jr., DDS;  [facility];  Service: Oral Surgery;  Laterality: Bilateral;  exam, cleaning, xrays, extractions teeth number seven, eight, nine, and ten; composite filling tooth number twenty-nine.   No family history on file. History  Substance Use Topics  . Smoking status: Never Smoker   . Smokeless tobacco: Never Used  . Alcohol Use: No   OB History   Grav Para Term Preterm Abortions TAB SAB Ect Mult Living                 Review of Systems  Unable to perform ROS Gastrointestinal: Negative for vomiting.  Skin: Positive for wound.  Neurological: Negative for syncope.     Allergies  Review of patient's allergies indicates no known allergies.  Home Medications   Prior to Admission medications   Medication Sig Start Date End Date Taking?  Authorizing Provider  acetaminophen (TYLENOL) 500 MG tablet Take 1 tablet (500 mg total) by mouth every 6 (six) hours as needed for pain. 02/29/12   Loren Raceravid Yelverton, MD  ALPRAZolam Prudy Feeler(XANAX) 0.5 MG tablet Take 0.5 mg by mouth at bedtime as needed. For anxiety/sleep.    Historical Provider, MD  diphenhydrAMINE (SOMINEX) 25 MG tablet Take 25 mg by mouth 2 (two) times daily.    Historical Provider, MD  divalproex (DEPAKOTE) 500 MG DR tablet Take 1,000 mg by mouth at bedtime.    Historical Provider, MD  docusate sodium (COLACE) 100 MG capsule Take 100 mg by mouth 2 (two) times daily.    Historical Provider, MD  gabapentin (NEURONTIN) 300 MG capsule Take 300 mg by mouth 2 (two) times daily.    Historical Provider, MD  levonorgestrel-ethinyl estradiol (SEASONALE,INTROVALE,JOLESSA) 0.15-0.03 MG tablet Take 1 tablet by mouth daily.    Historical Provider, MD  LORazepam (ATIVAN) 1 MG tablet Take 1 mg by mouth every 8 (eight) hours.    Historical Provider, MD  ondansetron (ZOFRAN ODT) 4 MG disintegrating tablet Take 1 tablet (4 mg total) by mouth every 8 (eight) hours as needed for nausea. 02/29/12   Loren Raceravid Yelverton, MD  polyethylene glycol [facility](MIRALAX / Ethelene HalGLYCOLAX) packet Take 17 g by mouth daily as needed. For constipation    Historical Provider, MD  QUEtiapine (SEROQUEL)  400 MG tablet Take 400 mg by mouth 2 (two) times daily.    Historical Provider, MD  traZODone (DESYREL) 100 MG tablet Take 200 mg by mouth at bedtime.    Historical Provider, MD   Triage Vitals: BP 127/88  Pulse 109  Temp(Src) 98.4 F (36.9 C) (Oral)  Resp 16  Ht 5\' 1"  (1.549 m)  Wt 175 lb (79.379 kg)  BMI 33.08 kg/m2  SpO2 100%  Physical Exam  Nursing note and vitals reviewed. Constitutional: She appears well-developed and well-nourished. No distress.  HENT:  Head: Head is with abrasion.    1cm superficial laceration to back of head  Eyes: Pupils are equal, round, and reactive to light.  Neck: Neck supple.  Cardiovascular: Normal  rate.   Pulmonary/Chest: Effort normal. No respiratory distress.  Abdominal: Soft.  Musculoskeletal: Normal range of motion.  Neurological: She is alert.  Skin: Skin is warm and dry.    ED Course  Procedures (including critical care time)  LACERATION REPAIR Performed by: Jimmye NormanSMITH,Ferris Tally JOHN Authorized by: Jimmye NormanSMITH,Nahmir Zeidman JOHN Consent: Verbal consent obtained. Risks and benefits: risks, benefits and alternatives were discussed Consent given by: mother/guardian Patient identity confirmed: provided demographic data Prepped and Draped in normal sterile fashion Wound explored  Laceration Location: scalp  Laceration Length: 1cm  No Foreign Bodies seen or palpated  Anesthesia: topical LET  Anesthetic total: 6 ml  Amount of cleaning: standard  Skin closure: staples  Number of staples: 2   Patient tolerance: Patient tolerated the procedure well with no immediate complications.  DIAGNOSTIC STUDIES: Oxygen Saturation is 100% on RA, normal by my interpretation.    COORDINATION OF CARE: 5:45 PM-Discussed treatment plan which includes wound care with pt at bedside and pt agreed to plan.     Labs Review Labs Reviewed - No data to display  Imaging Review No results found.   EKG Interpretation None     Patient with history of striking her head on objects when she does not get her way. No loss of consciousness. Superficial laceration to occipital area of scalp. Stapled wound closure. Head injury precautions and return symptoms discussed with mother. MDM   Final diagnoses:  None    Scalp laceration.   I personally performed the services described in this documentation, which was scribed in my presence. The recorded information has been reviewed and is accurate.      Jimmye Normanavid John Dayten Juba, NP 11/25/13 [phone]

## 2013-11-25 NOTE — ED Notes (Addendum)
Per Guardian : Pt has hx of severe mental retardation. Pt's guardian reports that pt hit her head against door. Pt has small laceration to back of head. Bleeding controlled. Mom denies LOC. States pt is at baseline. Pt at this nods no when asked is she is in pain. PERRLA. Guardian states "I just want to make sure she doesn't need stiches and for my paperwork."

## 2013-11-25 NOTE — ED Notes (Signed)
Pt starting to get anxious again. Mother at bedside stating "she's having a behavioral attack.  Can you do something for her?"  Explained to mother that provider was going to be going to room next to put staple in head.

## 2013-11-25 NOTE — Discharge Instructions (Signed)
Staple Care and Removal Your caregiver has used staples today to repair your wound. Staples are used to help a wound heal faster by holding the edges of the wound together. The staples can be removed when the wound has healed well enough to stay together after the staples are removed. A dressing (wound covering), depending on the location of the wound, may have been applied. This may be changed once per day or as instructed. If the dressing sticks, it may be soaked off with soapy water or hydrogen peroxide. Only take over-the-counter or prescription medicines for pain, discomfort, or fever as directed by your caregiver.  If you did not receive a tetanus shot today because you did not recall when your last one was given, check with your caregiver when you have your staples removed to determine if one is needed. Return to your caregiver's office in 1 week or as suggested to have your staples removed. SEEK IMMEDIATE MEDICAL CARE IF:   You have redness, swelling, or increasing pain in the wound.  You have pus coming from the wound.  You have a fever.  You notice a bad smell coming from the wound or dressing.  Your wound edges break open after staples have been removed. Document Released: 10/18/2000 Document Revised: 04/17/2011 Document Reviewed: 11/02/2004 [facility] Patient Information 2015 [employer] [place]. This information is not intended to replace advice given to you by your health care provider. Make sure you discuss any questions you have with your health care provider.    Symptoms to monitor for:      You are confused or sleepy.  You cannot be woken up.  You feel sick to your stomach (nauseous) or keep throwing up (vomiting).  Your dizziness or unsteadiness is getting worse.  You have very bad, lasting headaches that are not helped by medicine. Take medicines only as told by your doctor.  You cannot use your arms or legs like normal.  You cannot walk.  You notice changes in  the black spots in the center of the colored part of your eye (pupil).  You have clear or bloody fluid coming from your nose or ears.  You have trouble seeing.

## 2013-11-25 NOTE — ED Notes (Signed)
While cleaning the wound to posterior head, 1 cm laceration noted with unapproximated wound.  NP made aware of wound.  LET ordered and per provider, staple to be applied to wound.

## 2013-11-26 NOTE — ED Provider Notes (Signed)
Medical screening examination/treatment/procedure(s) were performed by non-physician practitioner and as supervising physician I was immediately available for consultation/collaboration.   EKG Interpretation None        Samuel JesterKathleen Manvir Prabhu, DO 11/26/13 1805

## 2014-02-26 DIAGNOSIS — N760 Acute vaginitis: Secondary | ICD-10-CM | POA: Diagnosis not present

## 2014-02-26 DIAGNOSIS — Z124 Encounter for screening for malignant neoplasm of cervix: Secondary | ICD-10-CM | POA: Diagnosis not present

## 2014-07-20 DIAGNOSIS — F72 Severe intellectual disabilities: Secondary | ICD-10-CM | POA: Diagnosis not present

## 2017-08-07 DIAGNOSIS — F329 Major depressive disorder, single episode, unspecified: Secondary | ICD-10-CM | POA: Insufficient documentation

## 2017-08-07 DIAGNOSIS — K5900 Constipation, unspecified: Secondary | ICD-10-CM | POA: Insufficient documentation

## 2017-08-07 DIAGNOSIS — G2401 Drug induced subacute dyskinesia: Secondary | ICD-10-CM | POA: Insufficient documentation

## 2017-11-22 DIAGNOSIS — R0683 Snoring: Secondary | ICD-10-CM | POA: Insufficient documentation

## 2018-10-18 DIAGNOSIS — Z6841 Body Mass Index (BMI) 40.0 and over, adult: Secondary | ICD-10-CM | POA: Insufficient documentation

## 2018-11-02 ENCOUNTER — Other Ambulatory Visit: Payer: Self-pay

## 2018-11-02 ENCOUNTER — Encounter (HOSPITAL_BASED_OUTPATIENT_CLINIC_OR_DEPARTMENT_OTHER): Payer: Self-pay | Admitting: Emergency Medicine

## 2018-11-02 ENCOUNTER — Emergency Department (HOSPITAL_BASED_OUTPATIENT_CLINIC_OR_DEPARTMENT_OTHER): Payer: Medicare Other

## 2018-11-02 ENCOUNTER — Emergency Department (HOSPITAL_BASED_OUTPATIENT_CLINIC_OR_DEPARTMENT_OTHER)
Admission: EM | Admit: 2018-11-02 | Discharge: 2018-11-02 | Disposition: A | Discharge status: Home / Self Care | Payer: Medicare Other | Attending: Emergency Medicine | Admitting: Emergency Medicine

## 2018-11-02 DIAGNOSIS — R05 Cough: Secondary | ICD-10-CM | POA: Diagnosis present

## 2018-11-02 DIAGNOSIS — R059 Cough, unspecified: Secondary | ICD-10-CM

## 2018-11-02 DIAGNOSIS — Z79899 Other long term (current) drug therapy: Secondary | ICD-10-CM | POA: Diagnosis not present

## 2018-11-02 NOTE — ED Provider Notes (Signed)
[facility] EMERGENCY DEPARTMENT Provider Note   CSN: [id number] Arrival date & time: 11/02/18  1306     History   Chief Complaint Chief Complaint  Patient presents with  . Cough    HPI Michele French is a 33 y.o. female.     33 y.o female with a PMH of Mental disorder, psychosis presents to the ED brought in by legal guardian with a complaint of cough.  Patient had a flu shot done about a 2 weeks ago.  Mother reports she has been having this dry cough, she has been given the patient Delsym to help with her symptoms without improvement.  Mother reports patient felt worse last night, states she was unable to sleep but she kept having coughing fits.  Mother denies any fever, chills, changes in behavior.  Patient has been eating appropriately, has mostly been eating soup and broth.  Mother denies any other complaints.     Past Medical History:  Diagnosis Date  . Constipation - functional   . Mental disorder   . Psychosis ([place])   . Severe mental retardation     There are no active problems to display for this patient.   Past Surgical History:  Procedure Laterality Date  . NO PAST SURGERIES    . TOOTH EXTRACTION  04/28/2011   Procedure: DENTAL RESTORATION/EXTRACTIONS;  Surgeon: Jamison Oka., DDS;  Location: [facility];  Service: Oral Surgery;  Laterality: Bilateral;  exam, cleaning, xrays, extractions teeth number seven, eight, nine, and ten; composite filling tooth number twenty-nine.     OB History   No obstetric history on file.      Home Medications    Prior to Admission medications   Medication Sig Start Date End Date Taking? Authorizing Provider  amantadine (SYMMETREL) 100 MG capsule Take 100 mg by mouth 2 (two) times daily.   Yes [provider]  ammonium lactate (AMLACTIN) 12 % cream Apply topically as needed for dry skin.   Yes [provider]  clonazePAM (KLONOPIN) 1 MG tablet Take 1 mg by mouth 3 (three) times daily. 0.5 mg  at 0900 0.5 mg at 3 pm 1 mg at bedtime   Yes [provider]  divalproex (DEPAKOTE) 500 MG DR tablet Take 500 mg by mouth daily.    Yes [provider]  docusate sodium (COLACE) 100 MG capsule Take 100 mg by mouth 2 (two) times daily.   Yes [provider]  ergocalciferol (VITAMIN D2) 1.25 MG (50000 UT) capsule Take 50,000 Units by mouth once a week.   Yes [provider]  gabapentin (NEURONTIN) 300 MG capsule Take 400 mg by mouth 3 (three) times daily.    Yes [provider]  hydrochlorothiazide (MICROZIDE) 12.5 MG capsule Take 12.5 mg by mouth daily.   Yes [provider]  ketoconazole (NIZORAL) 2 % cream Apply 1 application topically daily.   Yes [provider]  medroxyPROGESTERone (DEPO-PROVERA) 150 MG/ML injection Inject 150 mg into the muscle every 3 (three) months.   Yes [provider]  olopatadine (PATANOL) 0.1 % ophthalmic solution Place 1 drop into both eyes 2 (two) times daily.   Yes [provider]  QUEtiapine (SEROQUEL) 400 MG tablet Take 400 mg by mouth 2 (two) times daily.   Yes [provider]  ramelteon (ROZEREM) 8 MG tablet Take 8 mg by mouth at bedtime.   Yes [provider]  risperiDONE (RISPERDAL) 0.5 MG tablet Take 0.5 mg by mouth every  6 (six) hours as needed.   Yes [provider]  traZODone (DESYREL) 100 MG tablet Take 200 mg by mouth at bedtime.   Yes [provider]  acetaminophen (TYLENOL) 500 MG tablet Take 1 tablet (500 mg total) by mouth every 6 (six) hours as needed for pain. 02/29/12   Loren RacerYelverton, David, MD  levonorgestrel-ethinyl estradiol (SEASONALE,INTROVALE,JOLESSA) 0.15-0.03 MG tablet Take 1 tablet by mouth daily.    [provider]  ondansetron (ZOFRAN ODT) 4 MG disintegrating tablet Take 1 tablet (4 mg total) by mouth every 8 (eight) hours as needed for nausea. 02/29/12   Loren RacerYelverton, David, MD    Family History History reviewed. No  pertinent family history.  Social History Social History   Tobacco Use  . Smoking status: Never Smoker  . Smokeless tobacco: Never Used  Substance Use Topics  . Alcohol use: No  . Drug use: No     Allergies   Patient has no known allergies.   Review of Systems Review of Systems  Constitutional: Negative for chills and fever.  HENT: Negative for ear pain, postnasal drip, sinus pressure and sore throat.   Eyes: Negative for pain and visual disturbance.  Respiratory: Positive for cough (Dry). Negative for shortness of breath.   Cardiovascular: Negative for chest pain and palpitations.  Gastrointestinal: Negative for abdominal pain and vomiting.  Genitourinary: Negative for dysuria and hematuria.  Musculoskeletal: Negative for arthralgias and back pain.  Skin: Negative for color change and rash.  Neurological: Negative for seizures and syncope.  All other systems reviewed and are negative.    Physical Exam Updated Vital Signs BP 106/60   Pulse (!) 114   Ht 4\' 11"  (1.499 m)   Wt 106.6 kg   SpO2 100%   BMI 47.46 kg/m   Physical Exam Vitals signs and nursing note reviewed.  Constitutional:      Appearance: Normal appearance.  HENT:     Head: Normocephalic and atraumatic.     Nose: No congestion or rhinorrhea.  Eyes:     Pupils: Pupils are equal, round, and reactive to light.     Comments: Eyes appear slightly injected.  Neck:     Musculoskeletal: Normal range of motion and neck supple.  Cardiovascular:     Rate and Rhythm: Tachycardia present.  Pulmonary:     Effort: Pulmonary effort is normal.     Breath sounds: No wheezing.     Comments: No wheezing, rhonchi, rales. Abdominal:     General: Abdomen is flat. There is no distension.  Skin:    General: Skin is dry.  Neurological:     Mental Status: She is alert and oriented to person, place, and time.  Psychiatric:     Comments: Mental disorder at baseline.      ED Treatments / Results  Labs (all  labs ordered are listed, but only abnormal results are displayed) Labs Reviewed - No data to display  EKG None  Radiology Dg Chest 2 View  Result Date: 11/02/2018 CLINICAL DATA:  Cough EXAM: CHEST - 2 VIEW COMPARISON:  09/22/2010 FINDINGS: The heart size and mediastinal contours are within normal limits. Bandlike atelectasis or scarring of the right midlung. The visualized skeletal structures are unremarkable. IMPRESSION: Bandlike atelectasis or scarring of the right midlung. No definite acute appearing airspace opacity. Electronically Signed   By: Lauralyn PrimesAlex  Bibbey M.D.   On: 11/02/2018 13:50    Procedures Procedures (including critical care time)  Medications Ordered in ED Medications - No data to  display   Initial Impression / Assessment and Plan / ED Course  I have reviewed the triage vital signs and the nursing notes.  Pertinent labs & imaging results that were available during my care of the patient were reviewed by me and considered in my medical decision making (see chart for details).      Patient with a past medical history of disorder, psychosis presents the ED with complaints of cough x2 weeks.  Patient's mother reports patient had a flu shot 2 weeks ago, she then developed a dry cough, cough has been more consistently, reports she was unable to get any rest last night due to her cough.  Patient is overall well-appearing, heart rate slightly elevated however patient continues to have coughing fits during my evaluation.  According to her last 3 visits heart rate is usually ranging in the 100s during her visits.  Mother has provided her with some Delsym which has not improved her symptoms.   Vital signs are within normal limits, she is afebrile, oxygen saturations 100% on room air.  Unsure that whether this is related to flu vaccine.  Does not report any previous episodes with this.  According to mother patient is mostly homebound and has not had any sick contacts.   Portions  of this note were generated with Scientist, clinical (histocompatibility and immunogenetics). Dictation errors may occur despite best attempts at proofreading.  Final Clinical Impressions(s) / ED Diagnoses   Final diagnoses:  Cough    ED Discharge Orders    None       Claude Manges, PA-C 11/02/18 1522    Pricilla Loveless, MD 11/03/18 [phone]

## 2018-11-02 NOTE — Discharge Instructions (Addendum)
Your chest x-ray today was clear.  You may purchase some over-the-counter Mucinex to help with the cough.  Please follow-up with pediatrician on Monday for reevaluation and symptoms.

## 2018-11-02 NOTE — ED Triage Notes (Signed)
Patient had a flu shot 2 weeks ago. Her guardian states that she has had a cough since then. The cough has worsened and is now keeping her up at night

## 2018-11-27 DIAGNOSIS — R600 Localized edema: Secondary | ICD-10-CM | POA: Insufficient documentation

## 2019-07-04 DIAGNOSIS — R4701 Aphasia: Secondary | ICD-10-CM | POA: Insufficient documentation

## 2019-11-04 DIAGNOSIS — E559 Vitamin D deficiency, unspecified: Secondary | ICD-10-CM | POA: Insufficient documentation

## 2020-09-06 HISTORY — PX: DILATION AND CURETTAGE OF UTERUS: SHX78

## 2020-09-29 ENCOUNTER — Emergency Department (HOSPITAL_BASED_OUTPATIENT_CLINIC_OR_DEPARTMENT_OTHER)
Admission: EM | Admit: 2020-09-29 | Discharge: 2020-09-29 | Disposition: A | Discharge status: Home / Self Care | Payer: Medicare Other | Attending: Emergency Medicine | Admitting: Emergency Medicine

## 2020-09-29 ENCOUNTER — Other Ambulatory Visit: Payer: Self-pay

## 2020-09-29 ENCOUNTER — Emergency Department (HOSPITAL_BASED_OUTPATIENT_CLINIC_OR_DEPARTMENT_OTHER): Payer: Medicare Other

## 2020-09-29 ENCOUNTER — Encounter (HOSPITAL_BASED_OUTPATIENT_CLINIC_OR_DEPARTMENT_OTHER): Payer: Self-pay | Admitting: *Deleted

## 2020-09-29 DIAGNOSIS — R600 Localized edema: Secondary | ICD-10-CM | POA: Insufficient documentation

## 2020-09-29 DIAGNOSIS — R000 Tachycardia, unspecified: Secondary | ICD-10-CM | POA: Insufficient documentation

## 2020-09-29 DIAGNOSIS — R609 Edema, unspecified: Secondary | ICD-10-CM

## 2020-09-29 DIAGNOSIS — L819 Disorder of pigmentation, unspecified: Secondary | ICD-10-CM | POA: Insufficient documentation

## 2020-09-29 DIAGNOSIS — M7989 Other specified soft tissue disorders: Secondary | ICD-10-CM | POA: Diagnosis present

## 2020-09-29 LAB — COMPREHENSIVE METABOLIC PANEL
ALT: 17 U/L (ref 0–44)
AST: 13 U/L — ABNORMAL LOW (ref 15–41)
Albumin: 4 g/dL (ref 3.5–5.0)
Alkaline Phosphatase: 61 U/L (ref 38–126)
Anion gap: 7 (ref 5–15)
BUN: 16 mg/dL (ref 6–20)
CO2: 27 mmol/L (ref 22–32)
Calcium: 9.2 mg/dL (ref 8.9–10.3)
Chloride: 105 mmol/L (ref 98–111)
Creatinine, Ser: 0.81 mg/dL (ref 0.44–1.00)
GFR, Estimated: >60 mL/min (ref >60)
Glucose, Bld: 101 mg/dL — ABNORMAL HIGH (ref 70–99)
Potassium: 3.5 mmol/L (ref 3.5–5.1)
Sodium: 139 mmol/L (ref 135–145)
Total Bilirubin: 0.1 mg/dL — ABNORMAL LOW (ref 0.3–1.2)
Total Protein: 7.7 g/dL (ref 6.5–8.1)

## 2020-09-29 LAB — URINALYSIS, ROUTINE W REFLEX MICROSCOPIC
Bilirubin Urine: NEGATIVE
Glucose, UA: NEGATIVE mg/dL
Hgb urine dipstick: NEGATIVE
Ketones, ur: NEGATIVE mg/dL
Nitrite: NEGATIVE
Protein, ur: NEGATIVE mg/dL
Specific Gravity, Urine: 1.02 (ref 1.005–1.030)
pH: 7.5 (ref 5.0–8.0)

## 2020-09-29 LAB — CBC WITH DIFFERENTIAL/PLATELET
Abs Immature Granulocytes: 0.02 10*3/uL (ref 0.00–0.07)
Basophils Absolute: 0 10*3/uL (ref 0.0–0.1)
Basophils Relative: 0 %
Eosinophils Absolute: 0.1 10*3/uL (ref 0.0–0.5)
Eosinophils Relative: 1 %
HCT: 37.1 % (ref 36.0–46.0)
Hemoglobin: 11.8 g/dL — ABNORMAL LOW (ref 12.0–15.0)
Immature Granulocytes: 0 %
Lymphocytes Relative: 43 %
Lymphs Abs: 2.1 10*3/uL (ref 0.7–4.0)
MCH: 27.3 pg (ref 26.0–34.0)
MCHC: 31.8 g/dL (ref 30.0–36.0)
MCV: 85.7 fL (ref 80.0–100.0)
Monocytes Absolute: 0.5 10*3/uL (ref 0.1–1.0)
Monocytes Relative: 11 %
Neutro Abs: 2.2 10*3/uL (ref 1.7–7.7)
Neutrophils Relative %: 45 %
Platelets: 212 10*3/uL (ref 150–400)
RBC: 4.33 MIL/uL (ref 3.87–5.11)
RDW: 14.6 % (ref 11.5–15.5)
WBC: 5 10*3/uL (ref 4.0–10.5)
nRBC: 0 % (ref 0.0–0.2)

## 2020-09-29 LAB — URINALYSIS, MICROSCOPIC (REFLEX)

## 2020-09-29 LAB — BRAIN NATRIURETIC PEPTIDE: B Natriuretic Peptide: 11 pg/mL (ref 0.0–100.0)

## 2020-09-29 LAB — PREGNANCY, URINE: Preg Test, Ur: NEGATIVE

## 2020-09-29 NOTE — ED Triage Notes (Signed)
C/o bil leg swelling and pain x 1 month increased pain x 2 days

## 2020-09-29 NOTE — Discharge Instructions (Addendum)
Please keep appointment with the vascular surgery team as scheduled on 09/09.   I would recommend buying compression stockings as indicated by PCP as this will ultimately be the most beneficial.   Continue giving Tylenol and Ibuprofen as needed for pain  Return to the ED for any new/worsening symptoms

## 2020-09-29 NOTE — ED Provider Notes (Signed)
[facility] EMERGENCY DEPARTMENT Provider Note   CSN: [id number] Arrival date & time: 09/29/20  1522     History Chief Complaint  Patient presents with   Leg Swelling   LEVEL 5 CAVEAT - ID AND NONVERBAL  Michele French is a 35 y.o. female with PMHx severe intellectual disability who presents to the ED with mother/caregiver with complaint of gradual onset, constant, worsening, bilateral leg swelling for the past couple of weeks as well as some discoloration to ankles bilaterally. Per chart review pt was seen by PCP on 08/02 with same complaints for 3 months. It appears she is on HCTZ 25 mg for lower extremity edema that has not been helping. She had CBC and CMP done at PCP's office without acute findings. She was advised to wear 15-18 mmHg compression stockings with plans to see vascular surgery with concern for venous insufficiency. Mom reports that they are going to see the vascular surgeon sometime in September however pt has been complaining of worsening pain prompting ED visit today. Mom has been giving her Tylenol and Ibuprofen without relief. Mom reports her pain is mostly to the balls of her feet. Mom has been unable to get patient to wear compression stockings. She has been attempting to elevate her feet at nighttime however mom states that she likes to sleep on her side so this has been difficult as well. Mom states pt is "always winded" however denies worsening SOB.   The history is provided by the patient and medical records. The history is limited by a developmental delay.      Past Medical History:  Diagnosis Date   Constipation - functional    Mental disorder    Psychosis ([facility])    Severe mental retardation     There are no problems to display for this patient.   Past Surgical History:  Procedure Laterality Date   NO PAST SURGERIES     TOOTH EXTRACTION  04/28/2011   Procedure: DENTAL RESTORATION/EXTRACTIONS;  Surgeon: Esaw Dace., DDS;  Location:  [facility] OR;  Service: Oral Surgery;  Laterality: Bilateral;  exam, cleaning, xrays, extractions teeth number seven, eight, nine, and ten; composite filling tooth number twenty-nine.     OB History   No obstetric history on file.     No family history on file.  Social History   Tobacco Use   Smoking status: Never   Smokeless tobacco: Never  Substance Use Topics   Alcohol use: No   Drug use: No    Home Medications Prior to Admission medications   Medication Sig Start Date End Date Taking? Authorizing Provider  acetaminophen (TYLENOL) 500 MG tablet Take 1 tablet (500 mg total) by mouth every 6 (six) hours as needed for pain. 02/29/12   Loren Racer, MD  amantadine (SYMMETREL) 100 MG capsule Take 100 mg by mouth 2 (two) times daily.    [provider]  ammonium lactate (AMLACTIN) 12 % cream Apply topically as needed for dry skin.    [provider]  clonazePAM (KLONOPIN) 1 MG tablet Take 1 mg by mouth 3 (three) times daily. 0.5 mg at 0900 0.5 mg at 3 pm 1 mg at bedtime    [provider]  divalproex (DEPAKOTE) 500 MG DR tablet Take 500 mg by mouth daily.     [provider]  docusate sodium (COLACE) 100 MG capsule Take 100 mg by mouth 2 (two) times daily.    [provider]  ergocalciferol (VITAMIN D2) 1.25  MG (50000 UT) capsule Take 50,000 Units by mouth once a week.    [provider]  gabapentin (NEURONTIN) 300 MG capsule Take 400 mg by mouth 3 (three) times daily.     [provider]  hydrochlorothiazide (MICROZIDE) 12.5 MG capsule Take 12.5 mg by mouth daily.    [provider]  ketoconazole (NIZORAL) 2 % cream Apply 1 application topically daily.    [provider]  levonorgestrel-ethinyl estradiol (SEASONALE,INTROVALE,JOLESSA) 0.15-0.03 MG tablet Take 1 tablet by mouth daily.    [provider]  medroxyPROGESTERone (DEPO-PROVERA) 150 MG/ML injection Inject 150 mg into the muscle every 3  (three) months.    [provider]  olopatadine (PATANOL) 0.1 % ophthalmic solution Place 1 drop into both eyes 2 (two) times daily.    [provider]  ondansetron (ZOFRAN ODT) 4 MG disintegrating tablet Take 1 tablet (4 mg total) by mouth every 8 (eight) hours as needed for nausea. 02/29/12   Loren Racer, MD  QUEtiapine (SEROQUEL) 400 MG tablet Take 400 mg by mouth 2 (two) times daily.    [provider]  ramelteon (ROZEREM) 8 MG tablet Take 8 mg by mouth at bedtime.    [provider]  risperiDONE (RISPERDAL) 0.5 MG tablet Take 0.5 mg by mouth every 6 (six) hours as needed.    [provider]  traZODone (DESYREL) 100 MG tablet Take 200 mg by mouth at bedtime.    [provider]    Allergies    Patient has no known allergies.  Review of Systems   Review of Systems  Unable to perform ROS: Patient nonverbal  Cardiovascular:  Positive for leg swelling.  Musculoskeletal:  Positive for arthralgias.   Physical Exam Updated Vital Signs BP 120/81   Pulse (!) 105   Temp 98.6 F (37 C) (Oral)   Resp 16   Ht 4\' 11"  (1.499 m)   Wt 105.2 kg   LMP  (LMP Unknown)   SpO2 99%   BMI 46.86 kg/m   Physical Exam Vitals and nursing note reviewed.  Constitutional:      Appearance: She is obese. She is not ill-appearing or diaphoretic.  HENT:     Head: Normocephalic and atraumatic.  Cardiovascular:     Rate and Rhythm: Regular rhythm. Tachycardia present.     Pulses: Normal pulses.  Pulmonary:     Effort: Pulmonary effort is normal.     Breath sounds: Normal breath sounds. No wheezing, rhonchi or rales.  Abdominal:     Palpations: Abdomen is soft.  Musculoskeletal:     Cervical back: Neck supple.     Comments: 2+ pitting edema bilaterally however RLE appears slightly more swollen compared to LLE. Areas of hyperpigmentation mostly to the lateral aspect of bilateral ankles. 2+ DP pulses bilaterally. Feet are warm with good perfusion.  No obvious TTP appreciated. No calf TTP.   Skin:    General: Skin is warm and dry.  Neurological:     Mental Status: She is alert.    ED Results / Procedures / Treatments   Labs (all labs ordered are listed, but only abnormal results are displayed) Labs Reviewed  COMPREHENSIVE METABOLIC PANEL - Abnormal; Notable for the following components:      Result Value   Glucose, Bld 101 (*)    AST 13 (*)    Total Bilirubin 0.1 (*)    All other components within normal limits  CBC WITH DIFFERENTIAL/PLATELET - Abnormal; Notable for the following components:  Hemoglobin 11.8 (*)    All other components within normal limits  URINALYSIS, ROUTINE W REFLEX MICROSCOPIC - Abnormal; Notable for the following components:   APPearance CLOUDY (*)    Leukocytes,Ua TRACE (*)    All other components within normal limits  URINALYSIS, MICROSCOPIC (REFLEX) - Abnormal; Notable for the following components:   Bacteria, UA FEW (*)    All other components within normal limits  BRAIN NATRIURETIC PEPTIDE  PREGNANCY, URINE    EKG None  Radiology US Venous Img Lower Bilateral  Result Date: 09/29/2020 CLINICAL DATA:  Bilateral lower extremity pain. EXAM: BILATERAL LOWER EXTREMITY VENOUS DOPPLER ULTRASOUND TECHNIQUE: Gray-scale sonography with graded compression, as well as color Doppler and duplex ultrasound were performed to evaluate the lower extremity deep venous systems from the level of the common femoral vein and including the common femoral, femoral, profunda femoral, popliteal and calf veins including the posterior tibial, peroneal and gastrocnemius veins when visible. The superficial great saphenous vein was also interrogated. Spectral Doppler was utilized to evaluate flow at rest and with distal augmentation maneuvers in the common femoral, femoral and popliteal veins. COMPARISON:  None. FINDINGS: RIGHT LOWER EXTREMITY Common Femoral Vein: No evidence of thrombus. Normal compressibility, respiratory  phasicity and response to augmentation. Saphenofemoral Junction: No evidence of thrombus. Normal compressibility and flow on color Doppler imaging. Profunda Femoral Vein: No evidence of thrombus. Normal compressibility and flow on color Doppler imaging. Femoral Vein: No evidence of thrombus. Normal compressibility, respiratory phasicity and response to augmentation. Popliteal Vein: No evidence of thrombus. Normal compressibility, respiratory phasicity and response to augmentation. Calf Veins: No evidence of thrombus. Normal compressibility and flow on color Doppler imaging. Superficial Great Saphenous Vein: No evidence of thrombus. Normal compressibility. Venous Reflux:  None. Other Findings: No evidence of superficial thrombophlebitis or abnormal fluid collection. LEFT LOWER EXTREMITY Common Femoral Vein: No evidence of thrombus. Normal compressibility, respiratory phasicity and response to augmentation. Saphenofemoral Junction: No evidence of thrombus. Normal compressibility and flow on color Doppler imaging. Profunda Femoral Vein: No evidence of thrombus. Normal compressibility and flow on color Doppler imaging. Femoral Vein: Limited evaluation due small caliber but no thrombus identified. Popliteal Vein: Limited evaluation due to small caliber. No thrombus identified. Calf Veins: Left calf veins are poorly visualized. Superficial Great Saphenous Vein: No evidence of thrombus. Normal compressibility. Venous Reflux:  None. Other Findings: No evidence of superficial thrombophlebitis or abnormal fluid collection. IMPRESSION: No evidence of deep vein thrombosis in either lower extremity. Limited evaluation of the left lower extremity from the level of the femoral vein into the calf due to small caliber of the veins and poor visualization. Electronically Signed   By: Irish Lack M.D.   On: 09/29/2020 17:05    Procedures Procedures   Medications Ordered in ED Medications - No data to display  ED Course  I  have reviewed the triage vital signs and the nursing notes.  Pertinent labs & imaging results that were available during my care of the patient were reviewed by me and considered in my medical decision making (see chart for details).    MDM Rules/Calculators/A&P                           35 year old nonverbal intellectually disabled female presenting to the ED with mother/caregiver with complaints of bilateral lower extremity edema for the past couple of months. Seen by PCP with plans to see vascular surgeon next month for venous insufficiency. Mom  unable to get pt to wear compression stockings. On arrival to the ED today pt is initially tachycardic with hx of same however this has improved while in the room. She is noted to have 2+ pitting edema bilaterally however her RLE seems slightly more edematous compared to left. Pt also with some slight hyperpigmentation to lateral aspect of bilateral ankles. She has good distal pulses. Findings are likely consistent with chronic venous insufficiency however will plan to obtain labs and ultrasound at this time to rule out DVT. Mom reports pt is "always winded" and denies worsening SOB - will add on BNP at this time.   CBC without leukocytosis. Hgb stable at 11.8 CMP with normal Lfts, albumin 4.0. No electrolyte abnormalities.  BNP 11.0 U/A with trace leuks, 6-10 Wbcs, rare bacteria, however 6-10 squamous epithelial cells as well. Suspect contamination.  UPT negative  Ultrasound: IMPRESSION:  No evidence of deep vein thrombosis in either lower extremity.  Limited evaluation of the left lower extremity from the level of the  femoral vein into the calf due to small caliber of the veins and  poor visualization.   Workup overall reassuring in the ED today. Pt stable for discharge at this time. Have encouraged vascular surgery appointment as scheduled September 9th. Will apply ace wraps bilaterally in the ED today to see if this helps - mom does mention  she has actually not even bought the compression stockings yet as she did not believe pt would tolerate it. I have encouraged that she buy them and see how patient does with them as this will ultimately benefit her the most. Mom is in agreement with plan and pt stable for discharge home.   This note was prepared using Dragon voice recognition software and may include unintentional dictation errors due to the inherent limitations of voice recognition software.  Final Clinical Impression(s) / ED Diagnoses Final diagnoses:  Peripheral edema    Rx / DC Orders ED Discharge Orders     None        Discharge Instructions      Please keep appointment with the vascular surgery team as scheduled on 09/09.   I would recommend buying compression stockings as indicated by PCP as this will ultimately be the most beneficial.   Continue giving Tylenol and Ibuprofen as needed for pain  Return to the ED for any new/worsening symptoms       Tanda RockersVenter, Shelene Krage, PA-C 09/29/20 Berkley [id number]    Plunkett, Whitney, MD 09/29/20 [phone]

## 2020-11-12 DIAGNOSIS — F72 Severe intellectual disabilities: Secondary | ICD-10-CM | POA: Insufficient documentation

## 2020-11-12 DIAGNOSIS — N921 Excessive and frequent menstruation with irregular cycle: Secondary | ICD-10-CM | POA: Insufficient documentation

## 2020-11-12 DIAGNOSIS — N9089 Other specified noninflammatory disorders of vulva and perineum: Secondary | ICD-10-CM | POA: Insufficient documentation

## 2021-05-02 DIAGNOSIS — J301 Allergic rhinitis due to pollen: Secondary | ICD-10-CM | POA: Insufficient documentation

## 2021-06-23 ENCOUNTER — Ambulatory Visit (INDEPENDENT_AMBULATORY_CARE_PROVIDER_SITE_OTHER): Payer: Medicare Other | Admitting: Internal Medicine

## 2021-06-23 ENCOUNTER — Encounter: Payer: Self-pay | Admitting: Internal Medicine

## 2021-06-23 VITALS — BP 129/84 | HR 103 | Ht 59.0 in | Wt 226.4 lb

## 2021-06-23 DIAGNOSIS — R768 Other specified abnormal immunological findings in serum: Secondary | ICD-10-CM

## 2021-06-23 DIAGNOSIS — M79672 Pain in left foot: Secondary | ICD-10-CM

## 2021-06-23 DIAGNOSIS — M79671 Pain in right foot: Secondary | ICD-10-CM

## 2021-06-23 DIAGNOSIS — R21 Rash and other nonspecific skin eruption: Secondary | ICD-10-CM

## 2021-06-23 DIAGNOSIS — R600 Localized edema: Secondary | ICD-10-CM | POA: Diagnosis not present

## 2021-06-23 DIAGNOSIS — R4701 Aphasia: Secondary | ICD-10-CM | POA: Diagnosis not present

## 2021-06-23 MED ORDER — TRIAMCINOLONE ACETONIDE 0.5 % EX OINT: 1.0000 "application " | TOPICAL_OINTMENT | Freq: Two times a day (BID) | CUTANEOUS | 0 refills | Status: DC | PRN

## 2021-06-23 NOTE — Progress Notes (Signed)
Office Visit Note  Patient: Michele French             Date of Birth: February 10, 1985           MRN: [id number]             PCP: Barbarann Ehlers Referring: Barbarann Ehlers Visit Date: 06/23/2021  Subjective:   History of Present Illness: Michele French is a 36 y.o. female here for evaluation of multiple joint pains with positive RNP Abs. Symptoms started in the right foot but are ongoing with worsening during the past year and now nearly symmetrical involvement. At first there were 3 discrete spots on the lateral ankle but these progressed into a large hyperpigmented patch of skin. She has somewhat more edema in the right leg usually. No discrete ulcers or wounds are present. Pain is chronic and sometimes limits ability to stay in her adult day program. She takes meloxicam and gabapentin. Topical treatments including diclofenac and Aspercreme have not been highly effective. Lab testing for causes of pain and swelling showed positive ANA and a moderately positive RNP antibody titer. No finger or toe discoloration, no pain or swelling elsewhere, no unintentional weight loss, no noticeable lymphadenopathy reported.  Michele Cocking, Ms. Crayton's legal guardian, is present with her today. Patient is nonverbal so history was obtained from her guardian and review of medical records.  Labs reviewed ANA pos RNP 4.3 dsDNA, SM, SSA, SSB neg ESR 28 CRP <1  Activities of Daily Living:  Patient reports morning stiffness for 0 minutes.   Patient Reports nocturnal pain.  Difficulty dressing/grooming: Denies Difficulty climbing stairs: Denies Difficulty getting out of chair: Denies Difficulty using hands for taps, buttons, cutlery, and/or writing: Denies  Review of Systems  Constitutional:  Negative for fatigue.  HENT:  Negative for mouth sores, mouth dryness and nose dryness.   Eyes:  Negative for pain, redness, itching and dryness.  Respiratory:  Negative for shortness of breath and  difficulty breathing.   Cardiovascular:  Negative for chest pain and palpitations.  Gastrointestinal:  Negative for blood in stool, constipation and diarrhea.  Endocrine: Negative for increased urination.  Genitourinary:  Negative for difficulty urinating.  Musculoskeletal:  Positive for joint pain, joint pain and joint swelling. Negative for myalgias, morning stiffness, muscle tenderness and myalgias.  Skin:  Negative for color change, rash and redness.  Allergic/Immunologic: Negative for susceptible to infections.  Neurological:  Negative for dizziness, numbness, headaches and weakness.  Hematological:  Negative for bruising/bleeding tendency.  Psychiatric/Behavioral:  Negative for sleep disturbance.    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 11/12/2020   Severe intellectual disability 11/12/2020   Vitamin D deficiency 11/04/2019   Nonverbal 07/04/2019   Bilateral lower extremity edema 11/27/2018   Class 3 severe obesity due to excess calories without serious comorbidity with body mass index (BMI) of 45.0 to 49.9 in adult [facility]) 10/18/2018   Loud snoring 11/22/2017   Constipation 08/07/2017   Major depressive disorder 08/07/2017   Tardive dyskinesia 08/07/2017    Past Medical History:  Diagnosis Date   Constipation - functional    Mental disorder    Psychosis (HCC)    Severe mental retardation  History reviewed. No pertinent family history. Past Surgical History:  Procedure Laterality Date   DILATION AND CURETTAGE OF UTERUS  09/2020   NO PAST SURGERIES     TOOTH EXTRACTION  04/28/2011   Procedure: DENTAL RESTORATION/EXTRACTIONS;  Surgeon: Jamison Oka., DDS;  Location: [place];  Service: Oral Surgery;  Laterality: Bilateral;  exam, cleaning, xrays, extractions teeth number seven, eight, nine, and ten; composite filling tooth number twenty-nine.   Social History   Social History Narrative   Not on file    There is no immunization history on file for this patient.   Objective: Vital Signs: BP 129/84 (BP Location: Right Arm, Patient Position: Sitting, Cuff Size: Large)   Pulse (!) 103   Ht $R'4\' 11"'gO$  (1.499 m)   Wt 226 lb 6.4 oz (102.7 kg)   BMI 45.73 kg/m    Physical Exam Constitutional:      Appearance: She is obese.  Eyes:     Conjunctiva/sclera: Conjunctivae normal.  Cardiovascular:     Rate and Rhythm: Normal rate and regular rhythm.  Pulmonary:     Effort: Pulmonary effort is normal.     Breath sounds: Normal breath sounds.  Skin:    General: Skin is warm and dry.     Findings: Rash present.     Comments: Mild pitting edema worse above left ankle Flat hyperpigmented patch on lateral ankle on both sides with no induration or warmth, flat spotty lighter hyperpigmented patches extending partway up lateral side of shin, and extending down around medial foot onto bottom of foot arch  Neurological:     Mental Status: She is alert.  Psychiatric:        Mood and Affect: Mood normal.     Comments: Nonverbal     Musculoskeletal Exam:  Elbows full ROM no tenderness or swelling Wrists full ROM no tenderness or swelling Fingers full ROM no tenderness or swelling Knees full ROM no tenderness or swelling Ankles full ROM, some tenderness with pressure and movement in foot ankle not able to localize clearly   Investigation: No additional findings.  Imaging: No results found.  Recent Labs: Lab Results  Component Value Date   WBC 5.0 09/29/2020   HGB 11.8 (L) 09/29/2020   PLT 212 09/29/2020   NA 139 09/29/2020   K 3.5 09/29/2020   CL 105 09/29/2020   CO2 27 09/29/2020   GLUCOSE 101 (H) 09/29/2020   BUN 16 09/29/2020   CREATININE 0.81  09/29/2020   BILITOT 0.1 (L) 09/29/2020   ALKPHOS 61 09/29/2020   AST 13 (L) 09/29/2020   ALT 17 09/29/2020   PROT 7.7 09/29/2020   ALBUMIN 4.0 09/29/2020   CALCIUM 9.2 09/29/2020   GFRAA >90 03/01/2012    Speciality Comments: No specialty comments available.  Procedures:  No procedures performed Allergies: Patient has no known allergies.   Assessment / Plan:     Visit Diagnoses: Bilateral foot pain  Positive ANA - Plan: ANCA Screen Reflex Titer(QUEST), C3 and C4, ANA, Sedimentation rate, triamcinolone ointment (KENALOG) 0.5 %  Foot pain and swelling and positive ANA no obvious systemic findings on exam or history.  RNP titer appears somewhat nonspecific I do not see evidence consistent with a mixed connective tissue disease or systemic lupus.  We will check ANCA serum complements sedimentation rate and repeat ANA with pattern and titer.  Bilateral lower extremity edema Rash and other nonspecific skin eruption  Distribution of symptoms and skin changes looks very suggestive for vascular  process to me.  With previous benign lower extremity ultrasound study could be small vessel disease.  Symptoms not localized to any specific joint and not in a typical distribution for peripheral neuropathy.  I recommend initial trial of topical triamcinolone 0.5% on the affected areas.  If symptoms or not responding and no more specific findings on laboratory work-up would consider referring for biopsy of involved area.  Nonverbal  Somewhat difficult to obtain highly specific symptom description due to nonverbal status.  Limited feedback on exam plus history from her guardian.  Orders: Orders Placed This Encounter  Procedures   ANCA Screen Reflex Titer(QUEST)   C3 and C4   ANA   Sedimentation rate   Meds ordered this encounter  Medications   triamcinolone ointment (KENALOG) 0.5 %    Sig: Apply 1 application. topically 2 (two) times daily as needed.    Dispense:  30 g    Refill:  0      Follow-Up Instructions: Return in about 3 weeks (around 07/14/2021) for New pt foot pain f/u.   Collier Salina, MD  Note - This record has been created using [employer].  Chart creation errors have been sought, but may not always  have been located. Such creation errors do not reflect on  the standard of medical care.

## 2021-07-01 LAB — ANCA SCREEN W REFLEX TITER: ANCA SCREEN: NEGATIVE

## 2021-07-01 LAB — C3 AND C4
C3 Complement: 161 mg/dL (ref 83–193)
C4 Complement: 41 mg/dL (ref 15–57)

## 2021-07-01 LAB — SEDIMENTATION RATE: Sed Rate: 2 mm/h (ref 0–20)

## 2021-07-01 LAB — ANA: Anti Nuclear Antibody (ANA): NEGATIVE

## 2021-07-05 NOTE — Progress Notes (Signed)
Office Visit Note  Patient: Michele French             Date of Birth: 06/08/1985           MRN: [id number]             PCP: Barbarann Ehlers Referring: Jordan Hawks, PA-C Visit Date: 07/13/2021   Subjective:  Edema of the Left Foot and Edema of the Right Foot (Right worse than left )   History of Present Illness: Michele French is a 36 y.o. female here for follow up for multiple joint pains with positive RNP Abs.  Laboratory testing at our initial visit was negative for systemic inflammatory markers positive ANA ANCA serology or hypocomplementemia.  She continues to have a mild amount of pedal edema and a large confluent area of skin hyperpigmentation.  Still having partially effective treatment with meloxicam and gabapentin.  Previous HPI 06/23/2021 Michele French is a 36 y.o. female here for evaluation of multiple joint pains with positive RNP Abs. Symptoms started in the right foot but are ongoing with worsening during the past year and now nearly symmetrical involvement. At first there were 3 discrete spots on the lateral ankle but these progressed into a large hyperpigmented patch of skin. She has somewhat more edema in the right leg usually. No discrete ulcers or wounds are present. Pain is chronic and sometimes limits ability to stay in her adult day program. She takes meloxicam and gabapentin. Topical treatments including diclofenac and Aspercreme have not been highly effective. Lab testing for causes of pain and swelling showed positive ANA and a moderately positive RNP antibody titer. No finger or toe discoloration, no pain or swelling elsewhere, no unintentional weight loss, no noticeable lymphadenopathy reported.   Kandis Cocking, Ms. Chou's legal guardian, is present with her today. Patient is nonverbal so history was obtained from her guardian and review of medical records.   Labs reviewed ANA pos RNP 4.3 dsDNA, SM, SSA, SSB neg ESR 28 CRP <1   Review of  Systems  Constitutional:  Positive for fatigue.  HENT:  Negative for mouth dryness.   Eyes:  Negative for dryness.  Respiratory:  Negative for shortness of breath.   Cardiovascular:  Positive for swelling in legs/feet.  Gastrointestinal:  Positive for constipation.  Endocrine: Negative for excessive thirst.  Genitourinary:  Negative for difficulty urinating.  Musculoskeletal:  Positive for joint pain, joint pain and joint swelling.  Skin:  Positive for color change.  Allergic/Immunologic: Negative for susceptible to infections.  Neurological:  Negative for numbness.  Hematological:  Negative for bruising/bleeding tendency.  Psychiatric/Behavioral:  Negative for sleep disturbance.    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 11/12/2020   Severe intellectual disability 11/12/2020   Vitamin D deficiency 11/04/2019   Nonverbal 07/04/2019   Bilateral lower extremity edema 11/27/2018   Class 3 severe obesity due to excess calories without serious comorbidity with body mass index (BMI) of 45.0 to 49.9 in adult [facility]) 10/18/2018   Loud snoring 11/22/2017   Constipation 08/07/2017   Major depressive disorder 08/07/2017   Tardive dyskinesia 08/07/2017    Past Medical History:  Diagnosis Date   Constipation - functional  Mental disorder    Psychosis ([place])    Severe mental retardation     History reviewed. No pertinent family history. Past Surgical History:  Procedure Laterality Date   DILATION AND CURETTAGE OF UTERUS  09/2020   NO PAST SURGERIES     TOOTH EXTRACTION  04/28/2011   Procedure: DENTAL RESTORATION/EXTRACTIONS;  Surgeon: Jamison Oka., DDS;  Location: [place];  Service: Oral  Surgery;  Laterality: Bilateral;  exam, cleaning, xrays, extractions teeth number seven, eight, nine, and ten; composite filling tooth number twenty-nine.   Social History   Social History Narrative   Not on file    There is no immunization history on file for this patient.   Objective: Vital Signs: BP (!) 147/105 (BP Location: Left Arm, Patient Position: Sitting, Cuff Size: Large)   Pulse 99   Resp 13   Ht $R'4\' 11"'Fm$  (1.499 m)   Wt 225 lb (102.1 kg)   BMI 45.44 kg/m    Physical Exam Cardiovascular:     Rate and Rhythm: Normal rate and regular rhythm.  Pulmonary:     Effort: Pulmonary effort is normal.     Breath sounds: Normal breath sounds.  Skin:    General: Skin is warm and dry.  Neurological:     Mental Status: She is alert.  Psychiatric:     Comments: Nonverbal      Musculoskeletal Exam:  Elbows full ROM no tenderness or swelling Wrists full ROM no tenderness or swelling Fingers full ROM no tenderness or swelling Knees full ROM no tenderness or swelling Ankles full ROM, some tenderness with pressure and movement in foot ankle based on reaction not able to localize clearly  Investigation: No additional findings.  Imaging: No results found.  Recent Labs: Lab Results  Component Value Date   WBC 5.0 09/29/2020   HGB 11.8 (L) 09/29/2020   PLT 212 09/29/2020   NA 139 09/29/2020   K 3.5 09/29/2020   CL 105 09/29/2020   CO2 27 09/29/2020   GLUCOSE 101 (H) 09/29/2020   BUN 16 09/29/2020   CREATININE 0.81 09/29/2020   BILITOT 0.1 (L) 09/29/2020   ALKPHOS 61 09/29/2020   AST 13 (L) 09/29/2020   ALT 17 09/29/2020   PROT 7.7 09/29/2020   ALBUMIN 4.0 09/29/2020   CALCIUM 9.2 09/29/2020   GFRAA >90 03/01/2012    Speciality Comments: No specialty comments available.  Procedures:  No procedures performed Allergies: Patient has no known allergies.   Assessment / Plan:     Visit Diagnoses: Bilateral foot pain - Foot pain and swelling and positive ANA no  obvious systemic findings on exam or history.   No visible inflammatory changes structurally ankle appears pretty normal.  Agree with current treatments including meloxicam Tylenol and gabapentin for pain management.  Do not see evidence of a an inflammatory arthritis process to recommend any DMARD treatment regimen.  Bilateral lower extremity edema Rash and other nonspecific skin eruption - Distribution of symptoms and skin changes looks very suggestive for vascular process to me.    No systemic markers for vasculitis associated antibodies or inflammation signaling.  Could have some component of stasis dermatitis although presentation is not typical.  Recommend trying to apply topical triamcinolone in affected area for possible cutaneous vasculitis process.  If symptoms continue without improvement recommend dermatology referral for biopsy.  Nonverbal  Difficult to assess exact symptoms does express discomfort with manipulation of foot and ankle.  Orders: No orders of the defined types were placed  in this encounter.  No orders of the defined types were placed in this encounter.    Follow-Up Instructions: No follow-ups on file.   Collier Salina, MD  Note - This record has been created using [employer].  Chart creation errors have been sought, but may not always  have been located. Such creation errors do not reflect on  the standard of medical care.

## 2021-07-07 ENCOUNTER — Other Ambulatory Visit: Payer: Self-pay | Admitting: Internal Medicine

## 2021-07-07 DIAGNOSIS — M79671 Pain in right foot: Secondary | ICD-10-CM

## 2021-07-07 NOTE — Telephone Encounter (Signed)
Next Visit: 07/13/2021  Last Visit: 06/23/2021  Last Fill: 06/23/2021  Dx: Distribution of symptoms and skin changes looks very suggestive for vascular process   Current Dose per office note on 06/23/2021:  topical triamcinolone 0.5% on the affected areas.   Okay to refill Triamcinolone?

## 2021-07-13 ENCOUNTER — Encounter: Payer: Self-pay | Admitting: Internal Medicine

## 2021-07-13 ENCOUNTER — Ambulatory Visit (INDEPENDENT_AMBULATORY_CARE_PROVIDER_SITE_OTHER): Payer: Medicare Other | Admitting: Internal Medicine

## 2021-07-13 VITALS — BP 147/105 | HR 99 | Resp 13 | Ht 59.0 in | Wt 225.0 lb

## 2021-07-13 DIAGNOSIS — R600 Localized edema: Secondary | ICD-10-CM

## 2021-07-13 DIAGNOSIS — R21 Rash and other nonspecific skin eruption: Secondary | ICD-10-CM

## 2021-07-13 DIAGNOSIS — M79671 Pain in right foot: Secondary | ICD-10-CM | POA: Diagnosis not present

## 2021-07-13 DIAGNOSIS — R4701 Aphasia: Secondary | ICD-10-CM

## 2021-07-13 DIAGNOSIS — M79672 Pain in left foot: Secondary | ICD-10-CM

## 2021-07-13 MED ORDER — TRIAMCINOLONE ACETONIDE 0.5 % EX OINT: 1.0000 "application " | TOPICAL_OINTMENT | Freq: Three times a day (TID) | CUTANEOUS | 2 refills | Status: AC | PRN

## 2023-04-18 IMAGING — US US EXTREM LOW VENOUS
1 series · 13 of 24 positions shown · non-contrast
Comparison: None.

CLINICAL DATA: Bilateral lower extremity pain.



[Series 1: us extrem low venous · 13 of 55 slices shown]
[im 1/55]
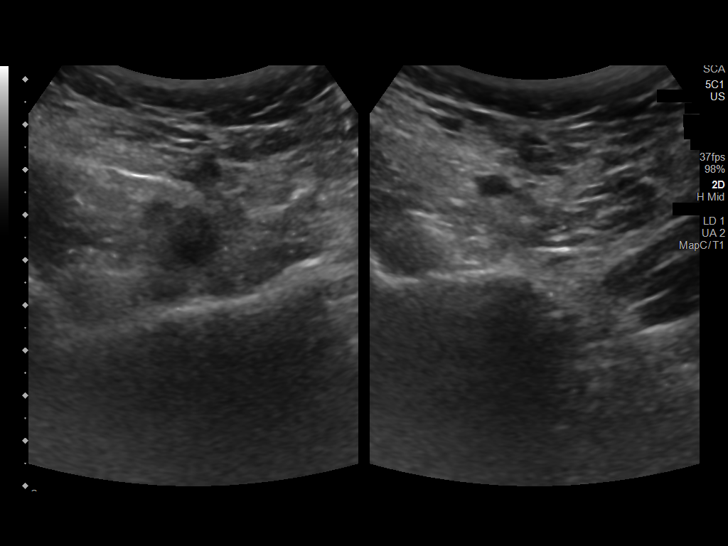
[im 5/55]
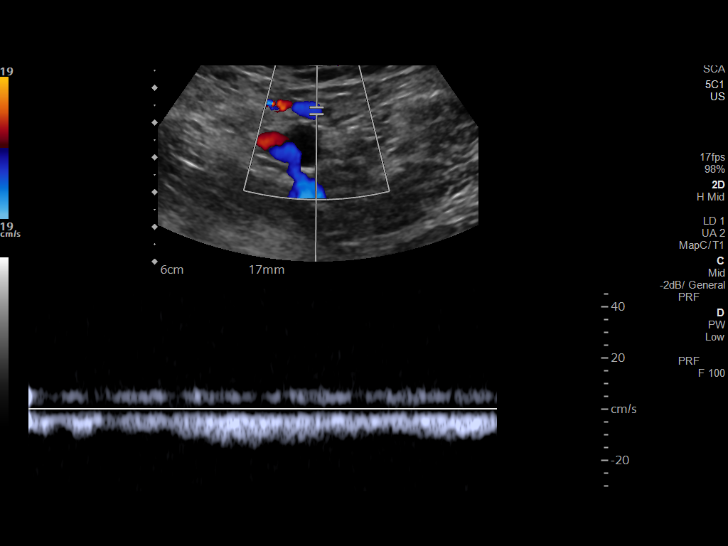
[im 10/55]
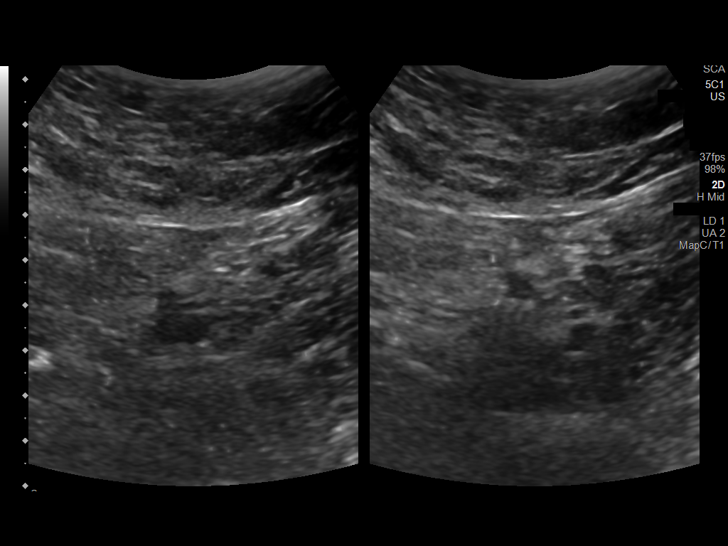
[im 15/55]
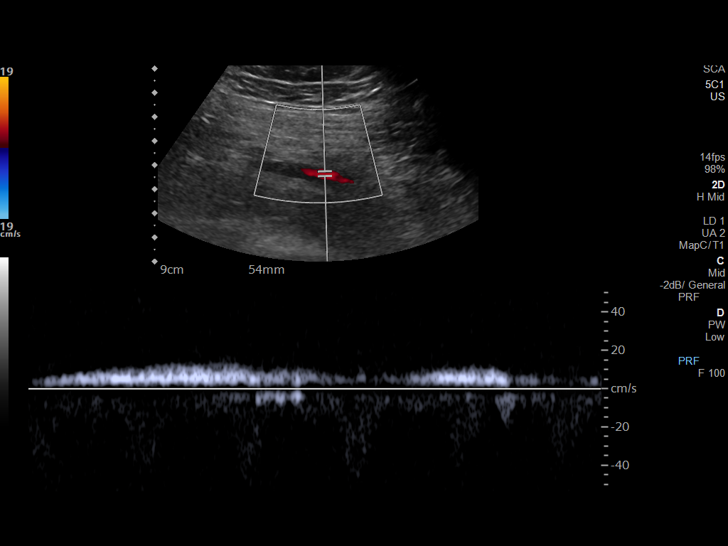
[im 19/55]
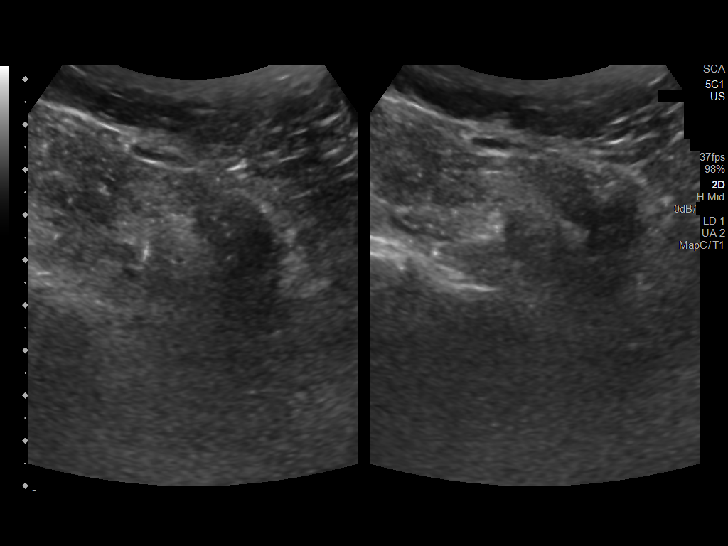
[im 24/55]
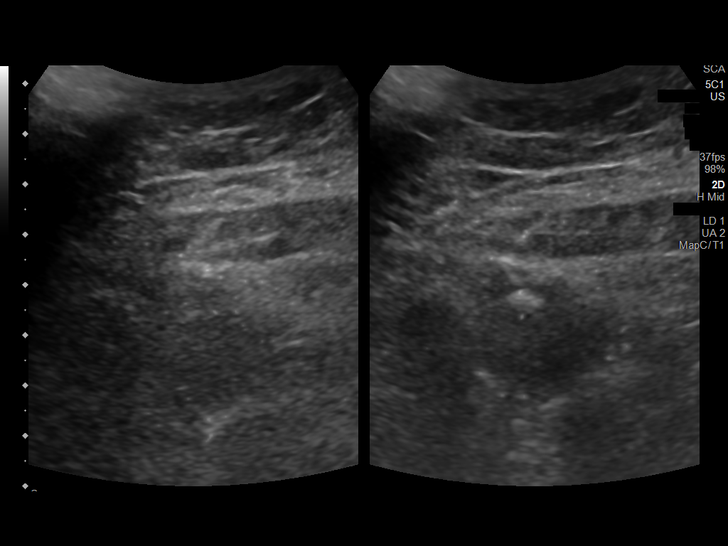
[im 29/55]
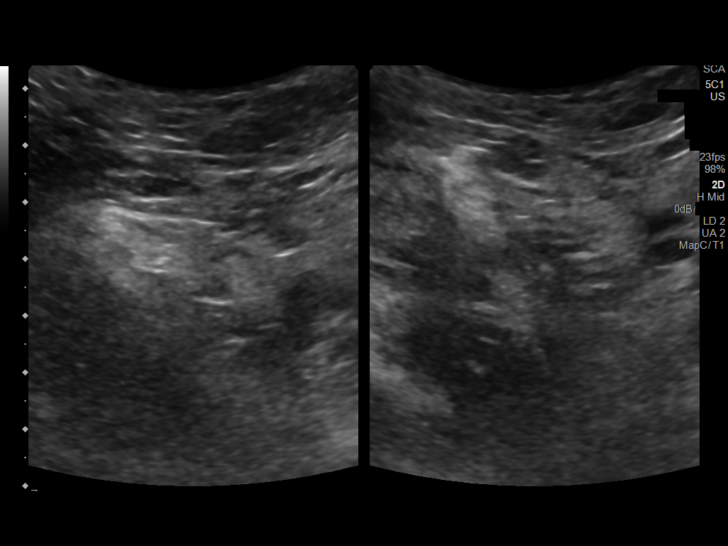
[im 31/55]
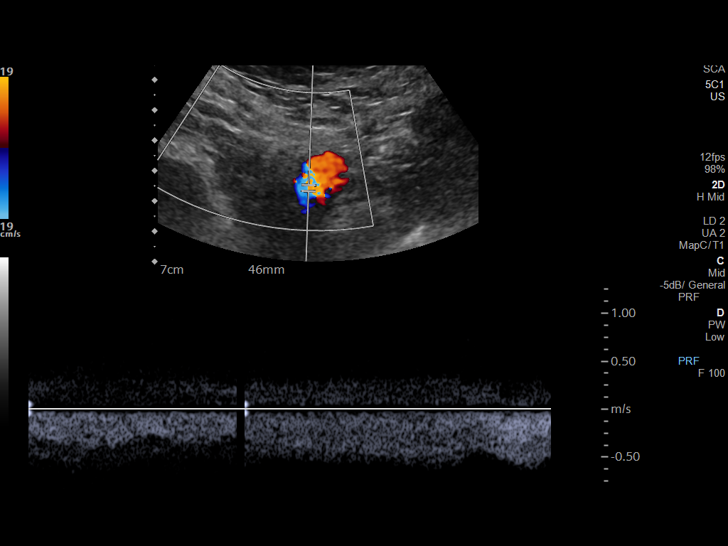
[im 36/55]
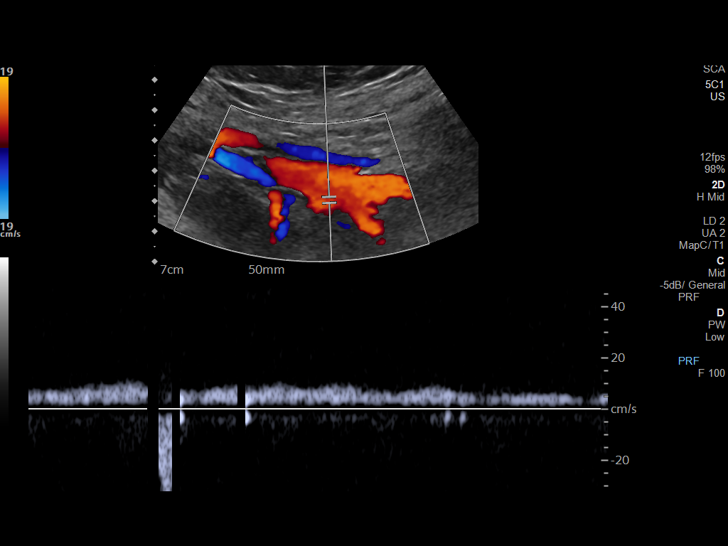
[im 40/55]
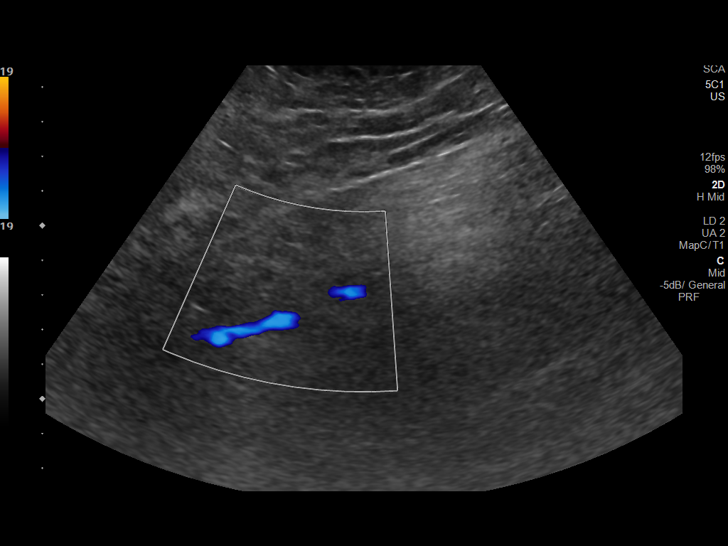
[im 45/55]
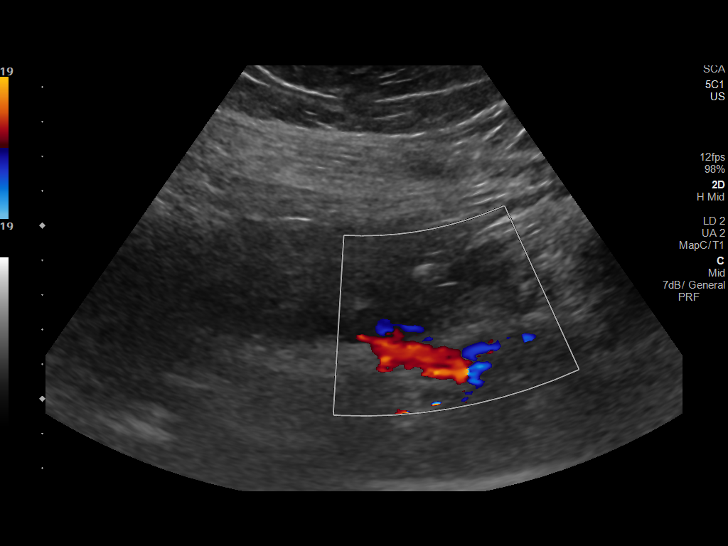
[im 50/55]
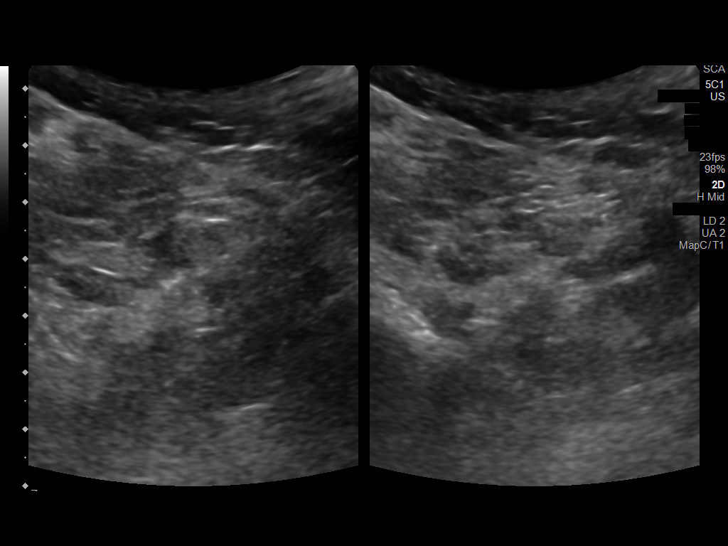
[im 55/55]
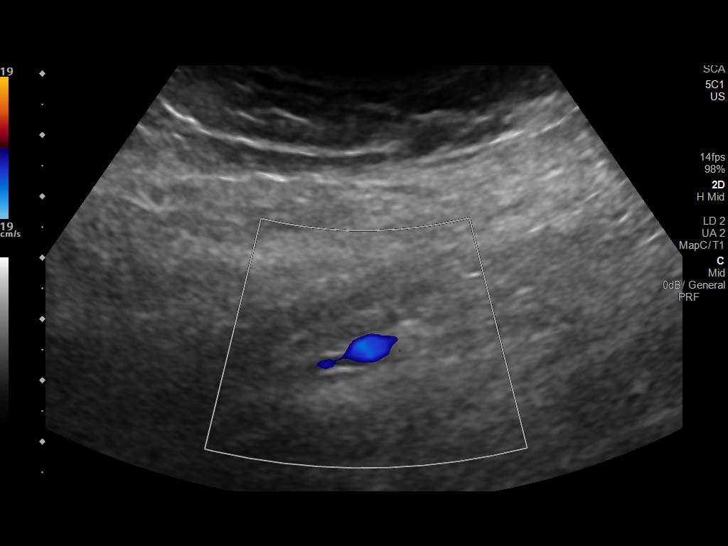

[13 of 24 positions shown; findings below may reference images not displayed]

FINDINGS: RIGHT LOWER EXTREMITY

Common Femoral Vein: No evidence of thrombus. Normal
compressibility, respiratory phasicity and response to augmentation.

Saphenofemoral Junction: No evidence of thrombus. Normal
compressibility and flow on color Doppler imaging.

Profunda Femoral Vein: No evidence of thrombus. Normal
compressibility and flow on color Doppler imaging.

Femoral Vein: No evidence of thrombus. Normal compressibility,
respiratory phasicity and response to augmentation.

Popliteal Vein: No evidence of thrombus. Normal compressibility,
respiratory phasicity and response to augmentation.

Calf Veins: No evidence of thrombus. Normal compressibility and flow
on color Doppler imaging.

Superficial Great Saphenous Vein: No evidence of thrombus. Normal
compressibility.

Venous Reflux:  None.

Other Findings: No evidence of superficial thrombophlebitis or
abnormal fluid collection.

LEFT LOWER EXTREMITY

Common Femoral Vein: No evidence of thrombus. Normal
compressibility, respiratory phasicity and response to augmentation.

Saphenofemoral Junction: No evidence of thrombus. Normal
compressibility and flow on color Doppler imaging.

Profunda Femoral Vein: No evidence of thrombus. Normal
compressibility and flow on color Doppler imaging.

Femoral Vein: Limited evaluation due small caliber but no thrombus
identified.

Popliteal Vein: Limited evaluation due to small caliber. No thrombus
identified.

Calf Veins: Left calf veins are poorly visualized.

Superficial Great Saphenous Vein: No evidence of thrombus. Normal
compressibility.

Venous Reflux:  None.

Other Findings: No evidence of superficial thrombophlebitis or
abnormal fluid collection.
IMPRESSION: No evidence of deep vein thrombosis in either lower extremity.
Limited evaluation of the left lower extremity from the level of the
femoral vein into the calf due to small caliber of the veins and
poor visualization.
